# Patient Record
Sex: Male | Born: 1965 | Race: Black or African American | Hispanic: No | Marital: Married | Smoking: Current some day smoker
Health system: Southern US, Community
[De-identification: ages and names within clinical notes are randomized; demographics above are authoritative.]

## PROBLEM LIST (undated history)

## (undated) DIAGNOSIS — C801 Malignant (primary) neoplasm, unspecified: Secondary | ICD-10-CM

## (undated) DIAGNOSIS — G8929 Other chronic pain: Secondary | ICD-10-CM

## (undated) DIAGNOSIS — M549 Dorsalgia, unspecified: Secondary | ICD-10-CM

## (undated) DIAGNOSIS — I10 Essential (primary) hypertension: Secondary | ICD-10-CM

## (undated) DIAGNOSIS — R531 Weakness: Secondary | ICD-10-CM

---
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
## 2008-07-12 HISTORY — PX: ANTERIOR CERVICAL DECOMP/DISCECTOMY FUSION: SHX1161

## 2008-07-21 ENCOUNTER — Inpatient Hospital Stay (HOSPITAL_COMMUNITY): Admission: AD | Admit: 2008-07-21 | Discharge: 2008-07-23 | Payer: Self-pay | Admitting: Orthopedic Surgery

## 2010-02-03 IMAGING — RF DG CERVICAL SPINE 2 OR 3 VIEWS
1 series · 1 of 1 positions shown · non-contrast
Comparison: 07/19/2008

CLINICAL DATA: C5-6, C6-7 ACDF

CERVICAL SPINE - 2-3 VIEW

[Series 1: run · 1 of 1 slices shown]
[im 1/1]
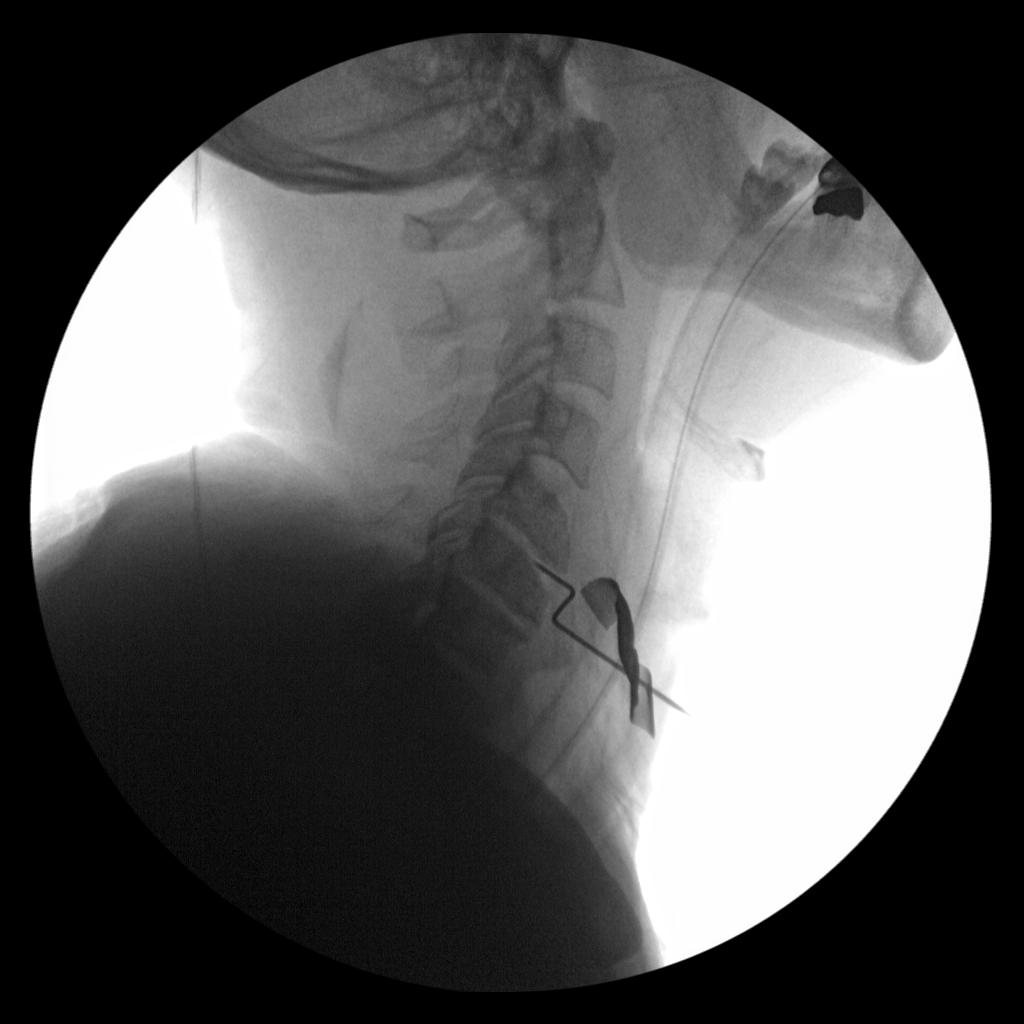

[1 of 1 positions shown; findings below may reference images not displayed]

FINDINGS: Single lateral intraoperative image of the cervical spine
demonstrates anterior localizing instrument at the C5-6 level.
IMPRESSION: Localization C5-6.

## 2010-08-24 LAB — BASIC METABOLIC PANEL
BUN: 9 mg/dL (ref 6–23)
CO2: 26 mEq/L (ref 19–32)
CO2: 28 mEq/L (ref 19–32)
Calcium: 9.2 mg/dL (ref 8.4–10.5)
Calcium: 9.4 mg/dL (ref 8.4–10.5)
Calcium: 9.5 mg/dL (ref 8.4–10.5)
Chloride: 104 mEq/L (ref 96–112)
Creatinine, Ser: 1.05 mg/dL (ref 0.4–1.5)
GFR calc Af Amer: >60 mL/min (ref >60)
GFR calc non Af Amer: >60 mL/min (ref >60)
GFR calc non Af Amer: >60 mL/min (ref >60)
Glucose, Bld: 105 mg/dL — ABNORMAL HIGH (ref 70–99)
Glucose, Bld: 123 mg/dL — ABNORMAL HIGH (ref 70–99)
Potassium: 3.7 mEq/L (ref 3.5–5.1)
Sodium: 135 mEq/L (ref 135–145)
Sodium: 138 mEq/L (ref 135–145)
Sodium: 139 mEq/L (ref 135–145)

## 2010-08-24 LAB — CBC
HCT: 41.4 % (ref 39.0–52.0)
Hemoglobin: 14.3 g/dL (ref 13.0–17.0)
Hemoglobin: 15.3 g/dL (ref 13.0–17.0)
MCHC: 34.1 g/dL (ref 30.0–36.0)
MCHC: 34.4 g/dL (ref 30.0–36.0)
MCV: 89.2 fL (ref 78.0–100.0)
Platelets: 168 10*3/uL (ref 150–400)
RDW: 13 % (ref 11.5–15.5)
RDW: 13.2 % (ref 11.5–15.5)
RDW: 13.6 % (ref 11.5–15.5)
WBC: 19 10*3/uL — ABNORMAL HIGH (ref 4.0–10.5)

## 2010-09-26 NOTE — Op Note (Signed)
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   OPERATIVE PROCEDURE:  Anterior cervical diskectomy fusion.   INSTRUMENTATION USED:  A 7-mm precut lordotic fibular strut intergraft  at 5-6 and 6-7 with a 34-mm anterior cervical Synthes vector plate with  16 mm screws.   FIRST ASSISTANT:  Vaughan Browner   COMPLICATIONS:  None.   CONDITION:  Stable.   HISTORY:  Travis Simpson is a very pleasant 45 year old gentleman who presented  to the care of my partner, Dr. Ethelene Hal, with ongoing significant neck and  left arm pain.  Clinical and radiographic analysis confirmed the  diagnosis of three-level degenerative cervical disease C5-6, C6-7, C7-  T1. The patient initially had some C8 symptoms. However, they were  minimal compared to the pain he had in the C6 and C7 distribution.  After discussing treatment options, which included two and a three level  ACDF, I recommend two level, since these were most pronounced levels and  clinically he had most of his symptoms in the C6-C7 distribution.  After  discussing treatment options and risks and benefits, he elected to  proceed with aforementioned procedure.   OPERATIVE NOTE:  The patient is brought to the operating room, placed  supine on the operating table.  After successful induction of general  anesthesia and endotracheal intubation, TEDs, SCDs, and a Foley were  applied.  The arms were secured to  the side.  Roll towels were placed  between the shoulder blades and the shoulders were taped down to to  allow for visualization with x-ray.  Anterior cervical spine was prepped  and draped in a standard fashion.  Time-out was done to confirm this was  the appropriate patient, surgical procedure, and symptomatic side.  Once  all in the room were agreed, I began the surgical procedure.   A left lateral incision was made in line with the sternocleidomastoid.  This is a standard Clementeen Graham approach to the anterior cervical  spine.  Sharp dissection was carried out down through the platysma and  then began bluntly dissected through the deep cervical fascia sweeping  of the trachea and esophagus medially and then protected carotid sheath  laterally with a finger.  At this point, I palpated the anterior  longitudinal ligament, then I placed a thyroid retractor.  With the  retractor in place, I began sweeping the prevertebral fascia and placed  an 18-gauge needle into the C5-6 disk space.  Intraoperative lateral  fluoroscopy confirmed that this with the appropriate level.   At this point in time, once the facet  level was identified, I then used  a Bovie to remove the anterior longitudinal ligament from the midbody C5  to the midbody of C7, and mobilizing longus coli muscles out laterally  to the level of the uncovertebral joints.  I then placed a Psychologist, counselling blade underneath the longus colli muscles, deflated the  endotracheal cuff, expanded the retractors and then reinflated the  endotracheal cuff.  I then placed distraction pins into the body of C5-  C6,  distracted to 5-6 disk space.   A 15 blade scalpel was used to incise the annulus and using a  combination of pituitary rongeurs, curettes, and Kerrison rongeurs, I  resected the entire disk material.  Posteriorly, there were significant  osteophyte formation and I used great care with 1-mm Kerrison resecting  the  osteophytes as much as I could.  I was able to sweep behind the  vertebral body with a nerve hook, ensuring that had an adequate  decompression.  I then repeated this entire procedure on 6-7 level.  Once I had both disks completed, I trialed implants and I noted that I  __________ distraction with indirect increase in the foraminal volume.  At this point with a satisfactory decompression done, I then packed to  precut fibular allograft struts and malleted into the appropriate depth.  There was significant anterior osteophytes noted, and resected.  At this  point with the osteophytes removed, I then contoured a 34-mm anterior  cervical plate and affixed it to the bodies of C5 and C7 with 16 mm  variable angle screws.  I used a single 80 mm screw and other 16 mm  screw at C6.  At the end of the case, I removed all of the retractors  and swept the lateral border to ensure that the esophagus was not  entrapped beneath the plate.  Once I was confirmed that the esophagus  was not entrapped underneath the plate, I then irrigated with copious  normal saline, obtained hemostasis using bipolar electrocautery,  irrigated copiously again, and then returned the trachea and esophagus  to midline.  I then closed platysma with interrupted 2-0 Vicryl sutures  and 3-0 Monocryl for the skin.  Steri-Strips, __________ dressing Aspen  collar was applied.  At the end of the case, all needles and sponge  counts were correct.  Final x-rays were satisfactory.  The patient was  extubated and transferred to PACU without incident.  At the end of the  case, all needle and sponge counts were correct.      Alvy Beal, MD  Electronically Signed     DDB/MEDQ  D:  07/21/2008  T:  07/22/2008  Job:  [id number]

## 2010-09-26 NOTE — H&P (Signed)
NAME:  Travis Simpson, Travis Simpson             ACCOUNT NO.:  [id number]   MEDICAL RECORD NO.:  [id number]          PATIENT TYPE:  AMB   LOCATION:  SDS                          FACILITY:  [facility]   PHYSICIAN:  Alvy Beal, MD    DATE OF BIRTH:  Oct 07, 1965   DATE OF ADMISSION:  DATE OF DISCHARGE:                              HISTORY & PHYSICAL   CHIEF COMPLAINT:  Neck and left-sided shoulder and arm pain.   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  The patient notes that ever since that time he  has had ongoing neck and left sided shoulder and arm pain.  He described  the pain in his shoulder and neck as a burning sensation and he gets  numbness and tingling down into his fourth and fifth digits in his left  hand.  The patient states the pain is approximately 8/10, and initially  starts in his fourth and fifth digit and eventually he states the rest  of the hand goes numb.  The patient states that he has tried physical  therapy and injection therapy and this has not provided him with any  relief.   PAST SURGICAL HISTORY:  Left arthroscopic knee surgery in 1981.   FAMILY HISTORY:  Unremarkable.   SOCIAL HISTORY:  The patient is a nonsmoker and does not use alcohol.  He is currently married and he lives at home with his wife and one  child.   CURRENT MEDICATIONS:  Hydrocodone and Mobic.   ALLERGIES:  NO KNOWN DRUG ALLERGIES.   REVIEW OF SYSTEMS:  Otherwise negative except stated in the HPI.   PHYSICAL EXAMINATION:  GENERAL:  Travis Simpson is a very pleasant 26-  year-old gentleman who appears his stated age.  He is alert and oriented  x3 and in no acute distress.  Mood and affect are appropriate.  His  primary care physician is Dr. Katrinka Blazing with [facility].  SKIN:  Otherwise negative for any rashes and/or bruises.  HEENT:  Atraumatic, normocephalic.  Eyes  were PERRL.  Throat was  negative for any erythema or tonsillar enlargement.  NECK:  Otherwise negative for any masses.  Negative for any trachea  deviation.  Thyroid was normal size.  He has mild decreased range of  motion of his spine secondary to pain.  HEART:  Regular rate and rhythm.  No murmurs, rubs or gallops.  LUNGS:  Chest was clear to auscultation with no wheezes or crackles  noted.  ABDOMEN:  Soft and nontender.  No rebound tenderness or guarding.  Spleen and liver size were within normal limits.  He had no CVA  tenderness.  MUSCULOSKELETAL:  Negative for any muscular atrophy.  He had some mild  weakness on his left side compared to his right upper extremity.  Isolated shoulder, elbow and wrist range of motion were within normal  limits.  He had no instability of his upper extremity joints.  VASCULAR:  Distal pulses were intact.  He had  no carotid bruits or JVD.  NEUROLOGIC:  Cranial nerves II-XII were grossly intact.  Sensations were  intact to light touch in his lower extremities.  On his upper  extremities, his left side was mildly decreased in sensation on the left  compared to the right.  Deep tendon reflexes were 2+ and symmetrical and  he had a normal gait pattern.   ASSESSMENT:  Degenerative disk disease of the cervical spine with  radicular arm pain.   PLAN:  At this time, the plan for this patient is to have him undergo an  anterior cervical diskectomy and fusion at the C5-6, C6-7 levels by Dr.  Shon Baton on July 21, 2008 at [facility].  Anticipated length of  stay is 1 day.      Crissie Reese, PA      Alvy Beal, MD  Electronically Signed    AC/MEDQ  D:  07/19/2008  T:  07/20/2008  Job:  [id number]

## 2010-09-29 NOTE — Discharge Summary (Signed)
Travis Simpson, Travis Simpson             ACCOUNT NO.:  [id number]   MEDICAL RECORD NO.:  [id number]          PATIENT TYPE:  INP   LOCATION:  5017                         FACILITY:  [facility]   PHYSICIAN:  Alvy Beal, MD    DATE OF BIRTH:  24-Dec-1965   DATE OF ADMISSION:  07/21/2008  DATE OF DISCHARGE:  07/23/2008                               DISCHARGE SUMMARY   ADMISSION DIAGNOSIS:  Degenerative cervical disk disease, C5-6 and C6-7  with hard disk osteophyte causing a left C6-7 radiculopathy.   DISCHARGE DIAGNOSIS:  Degenerative cervical disk disease, C5-6 and C6-7  with hard disk osteophyte causing a left C6-7 radiculopathy.   OPERATIVE PROCEDURE:  An anterior cervical diskectomy and fusion at C5-6  and at C6-7.   CONSULTATIONS:  None.   BRIEF HISTORY:  Mr. Grayer is a very pleasant 45 year old gentleman  who presented initially to Dr. Shon Baton' partner Dr. Ethelene Hal with ongoing  significant neck and left arm pain.  Clinical and radiographic analysis  confirmed the diagnosis of a 3-level degenerative disk disease at C5-6,  C6-7 as well as C7-T1.  The patient initially had some C8 symptoms,  however, they were minimal compared to the pain that he had at the C6  and C7 nerve distribution.  After discussing treatment options, which  included a 2 or 3 level ACDF.  Dr. Shon Baton recommended a 2-level since  those were the most pronounced levels and clinically, he had most of his  symptoms in the C6 and C7 distribution.  After discussing all treatment  options and risks and benefits, he elected to proceed with the  procedure.   HOSPITAL COURSE:  The patient's hospital course was approximately 2 days  in length.  He had tolerated the procedure very well and was transferred  from the OR to the PACU without incident and subsequently to the  orthopedic floor without any incident.  Postoperatively, on day #1, the  patient worked well with physical therapy.  He was tolerating regular  diet.  He  was voiding on his own, having a bowel movements and  compartments remained soft and nontender.  He had no chest pain or  shortness of breath.  Postoperatively, on day #2, the patient continued  to remain afebrile.  His labs were stable with a sodium of 139,  potassium 3.7, chloride 105, bicarb 27, glucose 89, BUN of 9, and  creatinine of 9.3.  Since the patient was afebrile, labs remained  stable, he was working well with physical therapy, he was deemed to be  in stable condition to discharge to home.   DISPOSITION:  Discharged to home.   DISCHARGE MEDICATIONS:  1. He is to continue his Flonase.  2. He is instructed to stop his meloxicam 7.5 mg as well as to stop      his hydrocodone 5/500 mg.  3. He is being discharged home on Percocet 10/325 one tab every 6      hours as needed for pain as well as Robaxin 500 mg 1 tablet every 8      hours as needed for muscle spasms.  DISCHARGE INSTRUCTIONS:  The patient is being discharged home with a  preprinted discharge instructions that he is allowed to shower  postoperatively, on day #5.  He needs to keep his cervical collar on at  all times.  He can increase activity slowly.  He can walk up steps.  No  lifting for 6 weeks.  He is not to lift anything heavier than a gallon  of milk, which is approximately 6-8 pounds.  No driving for at least 6  weeks.  He is to change his dressing daily for 7 days.  The patient is  to contact the office for increased fevers, chills, and/or arm pain,  increased cervical pain, loss of bowel or bladder function, or foot  drop.   FOLLOWUP:  The patient is instructed to follow up in our office with Dr.  Shon Baton in approximately 2 weeks for a wound check and to remove sutures.  The patient is given the phone number of [phone] to call and schedule  his appointment.      Crissie Reese, PA      Alvy Beal, MD  Electronically Signed    AC/MEDQ  D:  09/06/2008  T:  09/07/2008  Job:  [id number]    cc:   Alvy Beal, MD

## 2013-07-23 ENCOUNTER — Encounter (HOSPITAL_COMMUNITY): Payer: Self-pay | Admitting: Pharmacy Technician

## 2013-07-24 NOTE — Pre-Procedure Instructions (Signed)
Travis ShellingLarry E Simpson  07/24/2013   Your procedure is scheduled on:  Wed, Mar 18 @ 1:10 PM  [facility]  at 11:10 AM.  Call this number if you have problems the morning of surgery: [phone]   Remember:   Do not eat food or drink liquids after midnight.   Take these medicines the morning of surgery with A SIP OF WATER: Pain Pill(if needed)               No Goody's,BC's,Aleve,Aspirin,Ibuprofen,Fish Oil,or any Herbal Medications   Do not wear jewelry  Do not wear lotions, powders, or colognes. You may wear deodorant.  Men may shave face and neck.  Do not bring valuables to the hospital.  [facility] not responsible                  for any belongings or valuables.               Contacts, dentures or bridgework may not be worn into surgery.  Leave suitcase in the car. After surgery it may be brought to your room.  For patients admitted to the hospital, discharge time is determined by your                treatment team.                Special Instructions:  [place] - Preparing for Surgery  Before surgery, you can play an important role.  Because skin is not sterile, your skin needs to be as free of germs as possible.  You can reduce the number of germs on you skin by washing with CHG (chlorahexidine gluconate) soap before surgery.  CHG is an antiseptic cleaner which kills germs and bonds with the skin to continue killing germs even after washing.  Please DO NOT use if you have an allergy to CHG or antibacterial soaps.  If your skin becomes reddened/irritated stop using the CHG and inform your nurse when you arrive at Short Stay.  Do not shave (including legs and underarms) for at least 48 hours prior to the first CHG shower.  You may shave your face.  Please follow these instructions carefully:   1.  Shower with CHG Soap the night before surgery and the                                morning of Surgery.  2.  If you choose to wash your hair, wash your hair  first as usual with your       normal shampoo.  3.  After you shampoo, rinse your hair and body thoroughly to remove the                      Shampoo.  4.  Use CHG as you would any other liquid soap.  You can apply chg directly       to the skin and wash gently with scrungie or a clean washcloth.  5.  Apply the CHG Soap to your body ONLY FROM THE NECK DOWN.        Do not use on open wounds or open sores.  Avoid contact with your eyes,       ears, mouth and genitals (private parts).  Wash genitals (private parts)       with your normal soap.  6.  Wash thoroughly, paying special  attention to the area where your surgery        will be performed.  7.  Thoroughly rinse your body with warm water from the neck down.  8.  DO NOT shower/wash with your normal soap after using and rinsing off       the CHG Soap.  9.  Pat yourself dry with a clean towel.            10.  Wear clean pajamas.            11.  Place clean sheets on your bed the night of your first shower and do not        sleep with pets.  Day of Surgery  Do not apply any lotions/deoderants the morning of surgery.  Please wear clean clothes to the hospital/surgery center.     Please read over the following fact sheets that you were given: Pain Booklet, Coughing and Deep Breathing, Blood Transfusion Information, MRSA Information and Surgical Site Infection Prevention

## 2013-07-27 ENCOUNTER — Encounter (HOSPITAL_COMMUNITY): Payer: Self-pay

## 2013-07-27 ENCOUNTER — Encounter (HOSPITAL_COMMUNITY)
Admission: RE | Admit: 2013-07-27 | Discharge: 2013-07-27 | Disposition: A | Discharge status: Home / Self Care | Payer: 59 | Source: Ambulatory Visit | Attending: Orthopedic Surgery | Admitting: Orthopedic Surgery

## 2013-07-27 HISTORY — DX: Dorsalgia, unspecified: M54.9

## 2013-07-27 HISTORY — DX: Weakness: R53.1

## 2013-07-27 HISTORY — DX: Other chronic pain: G89.29

## 2013-07-27 LAB — CBC
HCT: 44.6 % (ref 39.0–52.0)
HEMOGLOBIN: 15 g/dL (ref 13.0–17.0)
MCH: 30.1 pg (ref 26.0–34.0)
MCHC: 33.6 g/dL (ref 30.0–36.0)
MCV: 89.6 fL (ref 78.0–100.0)
Platelets: 216 10*3/uL (ref 150–400)
RBC: 4.98 MIL/uL (ref 4.22–5.81)
RDW: 13.5 % (ref 11.5–15.5)
WBC: 9.7 10*3/uL (ref 4.0–10.5)

## 2013-07-27 LAB — SURGICAL PCR SCREEN
MRSA, PCR: NEGATIVE
Staphylococcus aureus: NEGATIVE

## 2013-07-27 LAB — TYPE AND SCREEN
ABO/RH(D): O POS
Antibody Screen: NEGATIVE

## 2013-07-27 LAB — ABO/RH: ABO/RH(D): O POS

## 2013-07-27 NOTE — H&P (Signed)
History of Present Illness  The patient is a 48 year old male who comes in today for a preoperative History and Physical. The patient is scheduled for a TLIF L4-5 to be performed by Dr. Debria Garret D. Shon Baton, MD at [facility] on 07/30/2013 . Please see the hospital record for complete dictated history and physical.   Follow-up backis described as the following: The patient is being followed for their back pain. Symptoms reported today include: pain, aching, leg pain (right) and foot pain (right). The patient states that they are doing poorly. Current treatment includes: NSAIDs (Advil). The following medication has been used for pain control: antiinflammatory medication. The patient reports their current pain level to be 6 / 10. The patient presents today following physical therapy (@ [facility]). The patient has not gotten any relief of their symptoms with physical therapy (increased pain).   48 year old male has a history of L4-5 stenosis and ongoing low back pain, buttock, and right leg pain returns. Symptoms are unchanged from previous visit. He is wanting to proceed with L4-5 TLIF as scheduled this week. No complaints of bowel or bladder incontinence.       Allergies No Known Drug Allergies. 12/23/2012    Family History Family history unknown - Adopted    Social History Alcohol use. current drinker; drinks beer; only occasionally per week Children. 1 Current work status. working full time Drug/Alcohol Rehab (Currently). no Drug/Alcohol Rehab (Previously). no Exercise. Exercises daily; does running / walking Illicit drug use. no Living situation. live with spouse Marital status. married Number of flights of stairs before winded. 2-3 Pain Contract. no Tobacco / smoke exposure. yes outdoors only    Medication History Advil (200MG  Tablet, Oral) Active. TraMADol HCl (50MG  Tablet, Oral) Active. (tid prn) Norco (7.5-325MG  Tablet, Oral) Active.  (tid prn)    Vitals 07/27/2013 8:57 AM Weight: 204 lb Height: 74 in Body Surface Area: 2.2 m Body Mass Index: 26.19 kg/m Temp.: 98.2 FPulse: 76 (Regular) BP: 121/74 (Sitting, Left Arm, Standard)     Objective Transcription  Very pleasant male, alert and oriented x3 and in no acute distress. Gait is normal. No increase in respiratory effort. Cervical spine is unremarkable. Head: Normocephalic, atraumatic. PERRLA EOMI. Lungs: CTA bilaterally. No wheezing noted. Heart: RRR, no murmurs noted. Abdomen: Round, nondistended. Negative bowel sounds x4, soft and nontender. He is neurologically intact. Skin is normal and dry.   Assessments Transcription  L4-5 stenosis with ongoing low back pain and right lower extremity radiculopathy. Failed conservative treatment.   We will proceed with L4-5 TLIF as scheduled. Discussed procedure along with potential rehab/recovery time and risk complications in great detail. All questions were answered and encouraged. The patient was seen in conjunction with Dr. Shon Baton.   The patient is a 48 year old male who has a history of low back pain and right lower extremity radiculopathy that returns today. As previously documented the patient had a lumbar spine MRI scan on 05-12-13. This showed L4-5 moderate right posterior lateral extrusion causing a mass effect on the extending L4 and traversing L5 root. Recently, he has been through formal physical therapy. He states he is not having improvement. No relief with ESI of September 2014. He states the pain radiates down his right buttock down to his right foot. Some pain in the left lateral hip area but nothing down his left leg. No complains of bowel or bladder incontinence. He has exhausted all conservative measures. He wants to proceed with surgery.  At  this point in time we had a long discussion. He has had previous injections, physical therapy, anti inflammatory medication, and  narcotic medication and he continues to deteriorate. This has gone on now for about 6 months now and it is progressively deteriorating his quality of life. Given the failure and the appropriate long term conservative management, I have recommended that we proceed with a T-LIF. This will allow for direct posterior neural decompression at the 4-5 level. Removing that fragmented disc that is irritating the nerve and stabilizing the level to address his back. The risks including infection, bleeding, nerve damage, death, stroke, paralysis, failure to heal, need for further surgery, blood clots, damage to the bowel and bladder and the major blood vessels, leak in spinal fluid, nonunion, and adjacent segment disease. We have discussed the risks and he has expressed an understanding of these. We have discussed the outcome, which is reduction, not elimination of his pain and improved quality of life. I would expect him to be in the hospital about 2-3 nights and then at 4 and 6 weeks start formalized therapy and hopefully around 3-5 months he will start reengaging at work and in his regular activities. He would be completely fused at around 9-12 months.

## 2013-07-27 NOTE — Progress Notes (Signed)
Pt doesn't have a cardiologist  Denies ever having an echo/stress test/heart cath  Medical Md is Tomasa HostellerDr.Cassandra Smith with [facility]  Denies EKG or CXR in past yr

## 2013-07-28 MED ORDER — ACETAMINOPHEN 10 MG/ML IV SOLN
1000.0000 mg | Freq: Four times a day (QID) | INTRAVENOUS | Status: DC
  Filled 2013-07-28: qty 100

## 2013-07-28 MED ORDER — DEXAMETHASONE SODIUM PHOSPHATE 4 MG/ML IJ SOLN
4.0000 mg | Freq: Once | INTRAMUSCULAR | Status: AC
  Administered 2013-07-29: 10 mg via INTRAVENOUS
  Filled 2013-07-28: qty 1

## 2013-07-29 ENCOUNTER — Inpatient Hospital Stay (HOSPITAL_COMMUNITY): Payer: 59 | Admitting: Anesthesiology

## 2013-07-29 ENCOUNTER — Inpatient Hospital Stay (HOSPITAL_COMMUNITY)
Admission: RE | Admit: 2013-07-29 | Discharge: 2013-08-01 | DRG: 460 | Disposition: A | Discharge status: Home / Self Care | Payer: 59 | Source: Ambulatory Visit | Attending: Orthopedic Surgery | Admitting: Orthopedic Surgery

## 2013-07-29 ENCOUNTER — Inpatient Hospital Stay (HOSPITAL_COMMUNITY): Payer: 59

## 2013-07-29 ENCOUNTER — Encounter (HOSPITAL_COMMUNITY): Payer: 59 | Admitting: Anesthesiology

## 2013-07-29 ENCOUNTER — Encounter (HOSPITAL_COMMUNITY)
Admission: RE | Disposition: A | Discharge status: Home / Self Care | Payer: Self-pay | Source: Ambulatory Visit | Attending: Orthopedic Surgery

## 2013-07-29 ENCOUNTER — Encounter (HOSPITAL_COMMUNITY): Payer: Self-pay | Admitting: *Deleted

## 2013-07-29 DIAGNOSIS — M549 Dorsalgia, unspecified: Secondary | ICD-10-CM

## 2013-07-29 DIAGNOSIS — M5137 Other intervertebral disc degeneration, lumbosacral region: Principal | ICD-10-CM | POA: Diagnosis present

## 2013-07-29 DIAGNOSIS — M51379 Other intervertebral disc degeneration, lumbosacral region without mention of lumbar back pain or lower extremity pain: Principal | ICD-10-CM | POA: Diagnosis present

## 2013-07-29 HISTORY — PX: POSTERIOR LUMBAR FUSION: SHX6036

## 2013-07-29 SURGERY — POSTERIOR LUMBAR FUSION 1 LEVEL
Anesthesia: General | Site: Spine Lumbar

## 2013-07-29 MED ORDER — LACTATED RINGERS IV SOLN
INTRAVENOUS | Status: DC | PRN
  Administered 2013-07-29 (×4): via INTRAVENOUS

## 2013-07-29 MED ORDER — MENTHOL 3 MG MT LOZG: 1.0000 | LOZENGE | OROMUCOSAL | Status: DC | PRN

## 2013-07-29 MED ORDER — ONDANSETRON HCL 4 MG/2ML IJ SOLN: 4.0000 mg | Freq: Four times a day (QID) | INTRAMUSCULAR | Status: DC | PRN

## 2013-07-29 MED ORDER — LACTATED RINGERS IV SOLN
INTRAVENOUS | Status: DC
  Administered 2013-07-30: 06:00:00 via INTRAVENOUS

## 2013-07-29 MED ORDER — OXYCODONE HCL 5 MG/5ML PO SOLN: 5.0000 mg | Freq: Once | ORAL | Status: DC | PRN

## 2013-07-29 MED ORDER — ONDANSETRON HCL 4 MG/2ML IJ SOLN: 4.0000 mg | Freq: Once | INTRAMUSCULAR | Status: DC | PRN

## 2013-07-29 MED ORDER — MORPHINE SULFATE (PF) 1 MG/ML IV SOLN
INTRAVENOUS | Status: AC
  Filled 2013-07-29: qty 25

## 2013-07-29 MED ORDER — ACETAMINOPHEN 325 MG PO TABS: 325.0000 mg | ORAL_TABLET | ORAL | Status: DC | PRN

## 2013-07-29 MED ORDER — DEXAMETHASONE 4 MG PO TABS
4.0000 mg | ORAL_TABLET | Freq: Four times a day (QID) | ORAL | Status: DC
  Administered 2013-07-29 – 2013-07-31 (×7): 4 mg via ORAL
  Filled 2013-07-29 (×14): qty 1

## 2013-07-29 MED ORDER — FENTANYL CITRATE 0.05 MG/ML IJ SOLN
INTRAMUSCULAR | Status: AC
  Filled 2013-07-29: qty 5

## 2013-07-29 MED ORDER — ONDANSETRON HCL 4 MG/2ML IJ SOLN
INTRAMUSCULAR | Status: DC | PRN
  Administered 2013-07-29 (×2): 4 mg via INTRAVENOUS

## 2013-07-29 MED ORDER — HYDROMORPHONE HCL PF 1 MG/ML IJ SOLN
INTRAMUSCULAR | Status: AC
  Administered 2013-07-29: 0.5 mg via INTRAVENOUS
  Filled 2013-07-29: qty 1

## 2013-07-29 MED ORDER — METHOCARBAMOL 500 MG PO TABS
500.0000 mg | ORAL_TABLET | Freq: Four times a day (QID) | ORAL | Status: DC | PRN
  Administered 2013-07-30 – 2013-08-01 (×4): 500 mg via ORAL
  Filled 2013-07-29 (×4): qty 1

## 2013-07-29 MED ORDER — LACTATED RINGERS IV SOLN
INTRAVENOUS | Status: DC
Start: 2013-07-29 — End: 2013-08-01
  Administered 2013-07-29: 12:00:00 via INTRAVENOUS

## 2013-07-29 MED ORDER — ONDANSETRON HCL 4 MG/2ML IJ SOLN
INTRAMUSCULAR | Status: AC
  Filled 2013-07-29: qty 2

## 2013-07-29 MED ORDER — OXYCODONE HCL 5 MG PO TABS: 5.0000 mg | ORAL_TABLET | Freq: Once | ORAL | Status: DC | PRN

## 2013-07-29 MED ORDER — LIDOCAINE HCL (CARDIAC) 20 MG/ML IV SOLN
INTRAVENOUS | Status: AC
  Filled 2013-07-29: qty 5

## 2013-07-29 MED ORDER — MIDAZOLAM HCL 5 MG/5ML IJ SOLN
INTRAMUSCULAR | Status: DC | PRN
  Administered 2013-07-29: 2 mg via INTRAVENOUS

## 2013-07-29 MED ORDER — DEXAMETHASONE SODIUM PHOSPHATE 4 MG/ML IJ SOLN
4.0000 mg | Freq: Four times a day (QID) | INTRAMUSCULAR | Status: DC
  Filled 2013-07-29 (×14): qty 1

## 2013-07-29 MED ORDER — PROPOFOL 10 MG/ML IV BOLUS
INTRAVENOUS | Status: AC
  Filled 2013-07-29: qty 20

## 2013-07-29 MED ORDER — ACETAMINOPHEN 160 MG/5ML PO SOLN: 325.0000 mg | ORAL | Status: DC | PRN

## 2013-07-29 MED ORDER — 0.9 % SODIUM CHLORIDE (POUR BTL) OPTIME
TOPICAL | Status: DC | PRN
  Administered 2013-07-29: 1000 mL

## 2013-07-29 MED ORDER — PROPOFOL 10 MG/ML IV BOLUS
INTRAVENOUS | Status: DC | PRN
  Administered 2013-07-29: 180 mg via INTRAVENOUS

## 2013-07-29 MED ORDER — FENTANYL CITRATE 0.05 MG/ML IJ SOLN
INTRAMUSCULAR | Status: DC | PRN
  Administered 2013-07-29 (×3): 100 ug via INTRAVENOUS
  Administered 2013-07-29: 50 ug via INTRAVENOUS
  Administered 2013-07-29: 100 ug via INTRAVENOUS
  Administered 2013-07-29: 50 ug via INTRAVENOUS
  Administered 2013-07-29 (×2): 100 ug via INTRAVENOUS
  Administered 2013-07-29: 50 ug via INTRAVENOUS
  Administered 2013-07-29 (×2): 100 ug via INTRAVENOUS
  Administered 2013-07-29: 50 ug via INTRAVENOUS

## 2013-07-29 MED ORDER — DIPHENHYDRAMINE HCL 50 MG/ML IJ SOLN: 12.5000 mg | Freq: Four times a day (QID) | INTRAMUSCULAR | Status: DC | PRN

## 2013-07-29 MED ORDER — LIDOCAINE HCL (CARDIAC) 20 MG/ML IV SOLN
INTRAVENOUS | Status: DC | PRN
  Administered 2013-07-29: 50 mg via INTRAVENOUS
  Administered 2013-07-29: 70 mg via INTRAVENOUS

## 2013-07-29 MED ORDER — SODIUM CHLORIDE 0.9 % IJ SOLN: 9.0000 mL | INTRAMUSCULAR | Status: DC | PRN

## 2013-07-29 MED ORDER — BUPIVACAINE-EPINEPHRINE 0.25% -1:200000 IJ SOLN
INTRAMUSCULAR | Status: DC | PRN
  Administered 2013-07-29: 10 mL

## 2013-07-29 MED ORDER — SUCCINYLCHOLINE CHLORIDE 20 MG/ML IJ SOLN
INTRAMUSCULAR | Status: DC | PRN
  Administered 2013-07-29: 100 mg via INTRAVENOUS

## 2013-07-29 MED ORDER — OXYCODONE HCL 5 MG PO TABS
10.0000 mg | ORAL_TABLET | ORAL | Status: DC | PRN
  Administered 2013-07-30 – 2013-08-01 (×8): 10 mg via ORAL
  Filled 2013-07-29 (×8): qty 2

## 2013-07-29 MED ORDER — ONDANSETRON HCL 4 MG/2ML IJ SOLN
4.0000 mg | INTRAMUSCULAR | Status: DC | PRN
  Administered 2013-07-31: 4 mg via INTRAVENOUS
  Filled 2013-07-29: qty 2

## 2013-07-29 MED ORDER — CEFAZOLIN SODIUM 1-5 GM-% IV SOLN
1.0000 g | Freq: Three times a day (TID) | INTRAVENOUS | Status: AC
  Administered 2013-07-29 – 2013-07-30 (×2): 1 g via INTRAVENOUS
  Filled 2013-07-29 (×2): qty 50

## 2013-07-29 MED ORDER — METHOCARBAMOL 100 MG/ML IJ SOLN
500.0000 mg | Freq: Four times a day (QID) | INTRAVENOUS | Status: DC | PRN
  Filled 2013-07-29: qty 5

## 2013-07-29 MED ORDER — PHENYLEPHRINE 40 MCG/ML (10ML) SYRINGE FOR IV PUSH (FOR BLOOD PRESSURE SUPPORT)
PREFILLED_SYRINGE | INTRAVENOUS | Status: AC
  Filled 2013-07-29: qty 10

## 2013-07-29 MED ORDER — SODIUM CHLORIDE 0.9 % IV SOLN: 250.0000 mL | INTRAVENOUS | Status: DC

## 2013-07-29 MED ORDER — HEMOSTATIC AGENTS (NO CHARGE) OPTIME
TOPICAL | Status: DC | PRN
  Administered 2013-07-29: 1 via TOPICAL

## 2013-07-29 MED ORDER — DOCUSATE SODIUM 100 MG PO CAPS
100.0000 mg | ORAL_CAPSULE | Freq: Two times a day (BID) | ORAL | Status: DC
  Administered 2013-07-29 – 2013-08-01 (×6): 100 mg via ORAL
  Filled 2013-07-29 (×8): qty 1

## 2013-07-29 MED ORDER — ACETAMINOPHEN 10 MG/ML IV SOLN
1000.0000 mg | Freq: Four times a day (QID) | INTRAVENOUS | Status: AC
  Administered 2013-07-29 – 2013-07-30 (×4): 1000 mg via INTRAVENOUS
  Filled 2013-07-29 (×4): qty 100

## 2013-07-29 MED ORDER — SODIUM CHLORIDE 0.9 % IJ SOLN
3.0000 mL | Freq: Two times a day (BID) | INTRAMUSCULAR | Status: DC
  Administered 2013-07-30 – 2013-07-31 (×3): 3 mL via INTRAVENOUS

## 2013-07-29 MED ORDER — HYDROMORPHONE HCL PF 1 MG/ML IJ SOLN
0.2500 mg | INTRAMUSCULAR | Status: DC | PRN
  Administered 2013-07-29 (×4): 0.5 mg via INTRAVENOUS

## 2013-07-29 MED ORDER — NALOXONE HCL 0.4 MG/ML IJ SOLN: 0.4000 mg | INTRAMUSCULAR | Status: DC | PRN

## 2013-07-29 MED ORDER — MIDAZOLAM HCL 2 MG/2ML IJ SOLN
INTRAMUSCULAR | Status: AC
  Filled 2013-07-29: qty 2

## 2013-07-29 MED ORDER — PHENOL 1.4 % MT LIQD: 1.0000 | OROMUCOSAL | Status: DC | PRN

## 2013-07-29 MED ORDER — FLEET ENEMA 7-19 GM/118ML RE ENEM: 1.0000 | ENEMA | Freq: Once | RECTAL | Status: AC | PRN

## 2013-07-29 MED ORDER — MORPHINE SULFATE (PF) 1 MG/ML IV SOLN
INTRAVENOUS | Status: DC
  Administered 2013-07-29: 19:00:00 via INTRAVENOUS
  Administered 2013-07-30: 15 mg via INTRAVENOUS
  Administered 2013-07-30: 23 mg via INTRAVENOUS
  Administered 2013-07-30: 6 mg via INTRAVENOUS
  Filled 2013-07-29: qty 25

## 2013-07-29 MED ORDER — BUPIVACAINE-EPINEPHRINE (PF) 0.25% -1:200000 IJ SOLN
INTRAMUSCULAR | Status: AC
  Filled 2013-07-29: qty 30

## 2013-07-29 MED ORDER — DIPHENHYDRAMINE HCL 12.5 MG/5ML PO ELIX: 12.5000 mg | ORAL_SOLUTION | Freq: Four times a day (QID) | ORAL | Status: DC | PRN

## 2013-07-29 MED ORDER — THROMBIN 20000 UNITS EX SOLR
CUTANEOUS | Status: AC
  Filled 2013-07-29: qty 20000

## 2013-07-29 MED ORDER — SODIUM CHLORIDE 0.9 % IJ SOLN: 3.0000 mL | INTRAMUSCULAR | Status: DC | PRN

## 2013-07-29 MED ORDER — CEFAZOLIN SODIUM-DEXTROSE 2-3 GM-% IV SOLR
INTRAVENOUS | Status: AC
  Administered 2013-07-29: 2 g via INTRAVENOUS
  Filled 2013-07-29: qty 50

## 2013-07-29 MED ORDER — LIDOCAINE HCL (CARDIAC) 20 MG/ML IV SOLN
INTRAVENOUS | Status: AC
  Filled 2013-07-29: qty 10

## 2013-07-29 MED ORDER — THROMBIN 20000 UNITS EX SOLR
CUTANEOUS | Status: DC | PRN
  Administered 2013-07-29: 16:00:00 via TOPICAL

## 2013-07-29 SURGICAL SUPPLY — 78 items
BLADE SURG ROTATE 9660 (MISCELLANEOUS) IMPLANT
BUR EGG ELITE 4.0 (BURR) IMPLANT
BUR EGG ELITE 4.0MM (BURR)
CLIP NEUROVISION LG (CLIP) ×3 IMPLANT
CLOSURE STERI-STRIP 1/2X4 (GAUZE/BANDAGES/DRESSINGS) ×2
CLSR STERI-STRIP ANTIMIC 1/2X4 (GAUZE/BANDAGES/DRESSINGS) ×4 IMPLANT
CORDS BIPOLAR (ELECTRODE) ×3 IMPLANT
COVER MAYO STAND STRL (DRAPES) ×6 IMPLANT
COVER SURGICAL LIGHT HANDLE (MISCELLANEOUS) ×3 IMPLANT
DRAPE C-ARM 42X72 X-RAY (DRAPES) ×3 IMPLANT
DRAPE C-ARMOR (DRAPES) ×3 IMPLANT
DRAPE ORTHO SPLIT 77X108 STRL (DRAPES) ×2
DRAPE POUCH INSTRU U-SHP 10X18 (DRAPES) ×6 IMPLANT
DRAPE SURG 17X23 STRL (DRAPES) ×3 IMPLANT
DRAPE SURG ORHT 6 SPLT 77X108 (DRAPES) ×1 IMPLANT
DRAPE U-SHAPE 47X51 STRL (DRAPES) ×3 IMPLANT
DRSG MEPILEX BORDER 4X4 (GAUZE/BANDAGES/DRESSINGS) ×3 IMPLANT
DRSG MEPILEX BORDER 4X8 (GAUZE/BANDAGES/DRESSINGS) ×3 IMPLANT
DURAPREP 26ML APPLICATOR (WOUND CARE) ×3 IMPLANT
ELECT BLADE 4.0 EZ CLEAN MEGAD (MISCELLANEOUS)
ELECT BLADE 6.5 EXT (BLADE) IMPLANT
ELECT REM PT RETURN 9FT ADLT (ELECTROSURGICAL) ×3
ELECTRODE BLDE 4.0 EZ CLN MEGD (MISCELLANEOUS) IMPLANT
ELECTRODE REM PT RTRN 9FT ADLT (ELECTROSURGICAL) ×1 IMPLANT
GLOVE BIOGEL PI IND STRL 8 (GLOVE) ×1 IMPLANT
GLOVE BIOGEL PI IND STRL 8.5 (GLOVE) ×1 IMPLANT
GLOVE BIOGEL PI INDICATOR 8 (GLOVE) ×2
GLOVE BIOGEL PI INDICATOR 8.5 (GLOVE) ×2
GLOVE ECLIPSE 8.5 STRL (GLOVE) ×3 IMPLANT
GLOVE ORTHO TXT STRL SZ7.5 (GLOVE) ×3 IMPLANT
GOWN STRL REUS W/ TWL LRG LVL3 (GOWN DISPOSABLE) ×1 IMPLANT
GOWN STRL REUS W/TWL 2XL LVL3 (GOWN DISPOSABLE) ×6 IMPLANT
GOWN STRL REUS W/TWL LRG LVL3 (GOWN DISPOSABLE) ×2
GUIDEWIRE NITINOL BEVEL TIP (WIRE) ×12 IMPLANT
KIT BASIN OR (CUSTOM PROCEDURE TRAY) ×3 IMPLANT
KIT NEEDLE NVM5 EMG ELECT (KITS) ×1 IMPLANT
KIT NEEDLE NVM5 EMG ELECTRODE (KITS) ×2
KIT POSITION SURG JACKSON T1 (MISCELLANEOUS) IMPLANT
KIT ROOM TURNOVER OR (KITS) ×3 IMPLANT
LIGHT SOURCE ANGLE TIP STR 7FT (MISCELLANEOUS) ×3 IMPLANT
MAS TLIF HOOP SHIM (KITS) ×3 IMPLANT
NEEDLE 22X1 1/2 (OR ONLY) (NEEDLE) ×3 IMPLANT
NEEDLE I-PASS III (NEEDLE) ×3 IMPLANT
NEEDLE SPNL 18GX3.5 QUINCKE PK (NEEDLE) ×6 IMPLANT
NS IRRIG 1000ML POUR BTL (IV SOLUTION) ×3 IMPLANT
PACK LAMINECTOMY ORTHO (CUSTOM PROCEDURE TRAY) ×3 IMPLANT
PACK UNIVERSAL I (CUSTOM PROCEDURE TRAY) ×3 IMPLANT
PAD ARMBOARD 7.5X6 YLW CONV (MISCELLANEOUS) ×6 IMPLANT
PATTIES SURGICAL .5 X.5 (GAUZE/BANDAGES/DRESSINGS) IMPLANT
PATTIES SURGICAL .5 X1 (DISPOSABLE) ×3 IMPLANT
PRECEPT SHANK 6.5X45 (Neuro Prosthesis/Implant) ×6 IMPLANT
PRECEPT TULIPS (Neuro Prosthesis/Implant) ×6 IMPLANT
PROBE BALL TIP NVM5 SNG USE (BALLOONS) ×3 IMPLANT
PUTTY BONE DBX 5CC MIX (Putty) ×3 IMPLANT
ROD 50MM (Rod) ×6 IMPLANT
SCREW POLYAXIAL 6.5X50MM (Screw) ×3 IMPLANT
SCREW PRECEPT 6.5X45 (Screw) ×3 IMPLANT
SCREW PRECEPT SET (Screw) ×12 IMPLANT
SCREW PRECEPT SHANK 6.5X50MM (Screw) ×3 IMPLANT
SHEET CONFORM 45LX20WX5H (Bone Implant) ×3 IMPLANT
SPONGE LAP 4X18 X RAY DECT (DISPOSABLE) ×6 IMPLANT
SPONGE SURGIFOAM ABS GEL 100 (HEMOSTASIS) ×3 IMPLANT
SURGIFLO TRUKIT (HEMOSTASIS) ×3 IMPLANT
SUT MON AB 3-0 SH 27 (SUTURE) ×4
SUT MON AB 3-0 SH27 (SUTURE) ×2 IMPLANT
SUT VIC AB 1 CT1 18XCR BRD 8 (SUTURE) ×2 IMPLANT
SUT VIC AB 1 CT1 8-18 (SUTURE) ×4
SUT VIC AB 1 CTX 18 (SUTURE) IMPLANT
SUT VIC AB 2-0 CT1 18 (SUTURE) ×3 IMPLANT
SYR BULB IRRIGATION 50ML (SYRINGE) ×3 IMPLANT
SYR CONTROL 10ML LL (SYRINGE) ×3 IMPLANT
TLIF XLRG 11MM (Neuro Prosthesis/Implant) ×3 IMPLANT
TOWEL OR 17X24 6PK STRL BLUE (TOWEL DISPOSABLE) ×3 IMPLANT
TOWEL OR 17X26 10 PK STRL BLUE (TOWEL DISPOSABLE) ×3 IMPLANT
TRAY FOLEY CATH 14FR (SET/KITS/TRAYS/PACK) ×3 IMPLANT
TRAY FOLEY CATH 16FRSI W/METER (SET/KITS/TRAYS/PACK) IMPLANT
WATER STERILE IRR 1000ML POUR (IV SOLUTION) IMPLANT
YANKAUER SUCT BULB TIP NO VENT (SUCTIONS) ×3 IMPLANT

## 2013-07-29 NOTE — Anesthesia Preprocedure Evaluation (Addendum)
Anesthesia Evaluation  Patient identified by MRN, date of birth, ID band Patient awake    Reviewed: Allergy & Precautions, H&P , NPO status , Patient's Chart, lab work & pertinent test results  History of Anesthesia Complications Negative for: history of anesthetic complications  Airway Mallampati: II TM Distance: >3 FB Neck ROM: Full    Dental  (+) Teeth Intact,    Pulmonary neg shortness of breath, neg sleep apnea, neg COPDCurrent Smoker,  breath sounds clear to auscultation        Cardiovascular - angina- Past MI, - CHF and - DOE - pacemaker- Valvular Problems/MurmursRhythm:Regular Rate:Normal     Neuro/Psych  L4-5 stenosis and ongoing low back pain, buttock, and right leg pain return  Neuromuscular disease negative psych ROS   GI/Hepatic negative GI ROS, Neg liver ROS,   Endo/Other  negative endocrine ROS  Renal/GU negative Renal ROS     Musculoskeletal   Abdominal   Peds  Hematology negative hematology ROS (+)   Anesthesia Other Findings   Reproductive/Obstetrics                          Anesthesia Physical Anesthesia Plan  ASA: II  Anesthesia Plan: General   Post-op Pain Management:    Induction: Intravenous  Airway Management Planned: Oral ETT  Additional Equipment: None  Intra-op Plan:   Post-operative Plan: Extubation in OR  Informed Consent: I have reviewed the patients History and Physical, chart, labs and discussed the procedure including the risks, benefits and alternatives for the proposed anesthesia with the patient or authorized representative who has indicated his/her understanding and acceptance.   Dental advisory given  Plan Discussed with: CRNA and Surgeon  Anesthesia Plan Comments:         Anesthesia Quick Evaluation

## 2013-07-29 NOTE — Transfer of Care (Signed)
Immediate Anesthesia Transfer of Care Note  Patient: Travis Simpson  Procedure(s) Performed: Procedure(s): TLIF L4-5  (N/A)  Patient Location: PACU  Anesthesia Type:General  Level of Consciousness: oriented, sedated, patient cooperative and responds to stimulation  Airway & Oxygen Therapy: Patient Spontanous Breathing and Patient connected to nasal cannula oxygen  Post-op Assessment: Report given to PACU RN, Post -op Vital signs reviewed and stable, Patient moving all extremities and Patient moving all extremities X 4  Post vital signs: Reviewed and stable  Complications: No apparent anesthesia complications

## 2013-07-29 NOTE — H&P (Signed)
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  Will be addressing the L4/5 level which cooresponds to the second to last full disc space. H+P reviewed

## 2013-07-29 NOTE — Brief Op Note (Signed)
07/29/2013  5:38 PM  PATIENT:  Travis Simpson  48 y.o. male  PRE-OPERATIVE DIAGNOSIS:  L4-5 DISC HERNATION WITH STENOSIS/DDD  POST-OPERATIVE DIAGNOSIS:  L4-5 DISC HERNATION WITH STENOSIS/DDD  PROCEDURE:  Procedure(s): TLIF L4-5  (N/A)  SURGEON:  Surgeon(s) and Role:    * Venita Lickahari Brylin Stanislawski, MD - Primary  PHYSICIAN ASSISTANT:   ASSISTANTS: Zonia KiefJames Owens   ANESTHESIA:   general  EBL:  Total I/O In: 2000 [I.V.:2000] Out: 400 [Urine:300; Blood:100]  BLOOD ADMINISTERED:none  DRAINS: none   LOCAL MEDICATIONS USED:  MARCAINE     SPECIMEN:  No Specimen  DISPOSITION OF SPECIMEN:  N/A  COUNTS:  YES  TOURNIQUET:  * No tourniquets in log *  DICTATION: .Other Dictation: Dictation Number [id number]  PLAN OF CARE: Admit for overnight observation  PATIENT DISPOSITION:  PACU - hemodynamically stable.

## 2013-07-30 ENCOUNTER — Encounter (HOSPITAL_COMMUNITY): Payer: Self-pay | Admitting: General Practice

## 2013-07-30 ENCOUNTER — Inpatient Hospital Stay (HOSPITAL_COMMUNITY): Payer: 59

## 2013-07-30 NOTE — Evaluation (Signed)
Physical Therapy Evaluation & Discharge Patient Details Name: Travis Simpson MRN: [id number] DOB: [birth year] Today's Date: 07/30/2013 Time: 4098-1191 PT Time Calculation (min): 27 min  PT Assessment / Plan / Recommendation History of Present Illness  Pt s/p posterior lumbar fusion 1 level for back pain  Clinical Impression  Patient evaluated by Physical Therapy with no further acute PT needs identified. All education has been completed and the patient has no further questions. Pt safely completed stair training and is not concerned with any other mobility  Issues upon discharge. See below for any follow-up Physial Therapy or equipment needs. PT is signing off. Thank you for this referral.     PT Assessment  Patent does not need any further PT services    Follow Up Recommendations  No PT follow up    Does the patient have the potential to tolerate intense rehabilitation      Barriers to Discharge        Equipment Recommendations  3in1 (PT);Rolling walker with 5" wheels    Recommendations for Other Services     Frequency      Precautions / Restrictions Precautions Precautions: Back Precaution Booklet Issued: Yes (comment) Precaution Comments: handout reviewed Required Braces or Orthoses: Spinal Brace Spinal Brace: Lumbar corset;Applied in sitting position Restrictions Weight Bearing Restrictions: No   Pertinent Vitals/Pain 5/10 - states nurse recently administered medications but will call for nurse if pain becomes worse Pt repositioned in chair for comfort; spinal brace on       Mobility  Bed Mobility Overal bed mobility: Needs Assistance Bed Mobility: Rolling;Sidelying to Sit Rolling: Supervision Sidelying to sit: Supervision General bed mobility comments: VC's for log rolling technique but overall supervision for bed mobility. pt compliant with precuations using this technique Transfers Overall transfer level: Needs assistance Equipment used: Rolling walker  (2 wheeled) Transfers: Sit to/from Stand Sit to Stand: Supervision General transfer comment: Supervision with verbal cues for sit<>stand for hand placement. Pt sit>stand from lowest bed setting with difficulty but follows precautions. Recommended pt to not sit on low level surfaces at home. States bed is elevated. Ambulation/Gait Ambulation/Gait assistance: Supervision Ambulation Distance (Feet): 150 Feet Assistive device: Rolling walker (2 wheeled) Gait Pattern/deviations: Step-through pattern General Gait Details: Pt ambulates well, seems a little aprehensive however is safely controling RW and does not need cues for placement or body position. Stairs: Yes Stairs assistance: Supervision Stair Management: Two rails;Step to pattern;Forwards Number of Stairs: 3 General stair comments: Safely navigates 4" steps similar to home environment - demonstrates ability to perform with supervision and has no questions/concerns    Exercises General Exercises - Lower Extremity Ankle Circles/Pumps: AROM;10 reps;Both;Seated Long Arc Quad: AROM;Both;5 reps;Seated   PT Diagnosis:    PT Problem List:   PT Treatment Interventions:       PT Goals(Current goals can be found in the care plan section) Acute Rehab PT Goals Patient Stated Goal: Go home PT Goal Formulation: With patient Time For Goal Achievement: 08/06/13 Potential to Achieve Goals: Good  Visit Information  Last PT Received On: 07/30/13 Assistance Needed: +1 History of Present Illness: Pt s/p posterior lumbar fusion 1 level for back pain       Prior Functioning  Home Living Family/patient expects to be discharged to:: Private residence Living Arrangements: Spouse/significant other;Children Available Help at Discharge: Family;Available 24 hours/day Type of Home: House Home Access: Stairs to enter [employer] of Steps: 1 small step Entrance Stairs-Rails: Left;Right;Can reach both Home Layout: One level Home Equipment:  None Prior Function Level of Independence: Independent Communication Communication: No difficulties Dominant Hand: Right    Cognition  Cognition Arousal/Alertness: Awake/alert Behavior During Therapy: WFL for tasks assessed/performed Overall Cognitive Status: Within Functional Limits for tasks assessed    Extremity/Trunk Assessment Upper Extremity Assessment Upper Extremity Assessment: Overall WFL for tasks assessed Lower Extremity Assessment Lower Extremity Assessment: Overall WFL for tasks assessed Cervical / Trunk Assessment Cervical / Trunk Assessment: Normal   Balance Balance Overall balance assessment: Needs assistance Sitting-balance support: No upper extremity supported Sitting balance-Leahy Scale: Fair Sitting balance - Comments: sits EOB without assist and dons spinal brace Standing balance support: No upper extremity supported Standing balance-Leahy Scale: Fair Standing balance comment: Brief period standing without UE support when transitioning to steps  End of Session PT - End of Session Equipment Utilized During Treatment: Gait belt;Back brace Activity Tolerance: Patient tolerated treatment well Patient left: in chair;with family/visitor present Nurse Communication: Mobility status  GP    [employer] [place], [place] [phone]  Berton Mount 07/30/2013, 1:19 PM

## 2013-07-30 NOTE — Progress Notes (Signed)
Occupational Therapy Evaluation and Discharge Patient Details Name: Travis Simpson MRN: [id number] DOB: 07-25-1965 Today's Date: 07/30/2013 Time: 9629-5284 OT Time Calculation (min): 24 min  OT Assessment / Plan / Recommendation History of present illness Pt s/p posterior lumbar fusion 1 level for back pain   Clinical Impression   PTA pt lived at home with wife and children and was Independent in ADLs and mobility. Pt required min A for transfers due to deconditioning and weakness but improved over the course of the session. Education and training completed regarding compensatory techniques for LB dressing, back precautions, and energy conservation. Pt performed tub transfer with min guard with wife present. No further acute OT needs at this time.     OT Assessment  Patient does not need any further OT services    Follow Up Recommendations  No OT follow up;Supervision/Assistance - 24 hour       Equipment Recommendations  None recommended by OT          Precautions / Restrictions Precautions Precautions: Back Precaution Booklet Issued: Yes (comment) Required Braces or Orthoses: Spinal Brace Spinal Brace: Lumbar corset;Applied in sitting position Restrictions Weight Bearing Restrictions: No   Pertinent Vitals/Pain Pt with no c/o pain.     ADL  Eating/Feeding: Independent Where Assessed - Eating/Feeding: Chair Grooming: Independent Where Assessed - Grooming: Supported standing Upper Body Bathing: Set up;Supervision/safety Where Assessed - Upper Body Bathing: Unsupported sitting Lower Body Bathing: Minimal assistance (for feet) Where Assessed - Lower Body Bathing: Supported sit to stand Upper Body Dressing: Set up;Supervision/safety Where Assessed - Upper Body Dressing: Unsupported sitting Lower Body Dressing: Moderate assistance Where Assessed - Lower Body Dressing: Supported sit to Pharmacist, hospital: Minimal Dentist Method: Sit to Teacher, music:  (sit<>stand bed to recliner) Toileting - Clothing Manipulation and Hygiene: Supervision/safety (for back precautions) Where Assessed - Glass blower/designer Manipulation and Hygiene: Standing Tub/Shower Transfer: Min Psychologist, prison and probation services Method: Ambulating Equipment Used: Back brace;Rolling walker Transfers/Ambulation Related to ADLs: Pt required min A to stand due to decreased strength but improved during session.         Visit Information  Last OT Received On: 07/30/13 Assistance Needed: +1 History of Present Illness: Pt s/p posterior lumbar fusion 1 level for back pain       Prior Functioning     Home Living Family/patient expects to be discharged to:: Private residence Living Arrangements: Spouse/significant other;Children Available Help at Discharge: Family;Available 24 hours/day Type of Home: House Home Access: Stairs to enter [employer] of Steps: 1 small step Entrance Stairs-Rails: None Home Layout: One level Home Equipment: None Prior Function Level of Independence: Independent Communication Communication: No difficulties            Cognition  Cognition Arousal/Alertness: Awake/alert Behavior During Therapy: WFL for tasks assessed/performed Overall Cognitive Status: Within Functional Limits for tasks assessed    Extremity/Trunk Assessment Upper Extremity Assessment Upper Extremity Assessment: Overall WFL for tasks assessed     Mobility Bed Mobility Overal bed mobility: Needs Assistance Bed Mobility: Rolling;Sidelying to Sit Rolling: Min guard Sidelying to sit: Min guard General bed mobility comments: VC's for log rolling technique but overall Min Guard for bed mobility Transfers Overall transfer level: Needs assistance Equipment used: Rolling walker (2 wheeled) Transfers: Sit to/from Stand Sit to Stand: Min assist General transfer comment: Min A to stand due to some deconditioning/weakness but improved over  course of session. Encouraged to participate in therapies for strengthening.  End of Session OT - End of Session Equipment Utilized During Treatment: Gait belt;Rolling walker;Back brace Activity Tolerance: Patient tolerated treatment well Patient left: in chair;with call bell/phone within reach;with family/visitor present       Rae Lips [phone] 07/30/2013, 11:07 AM

## 2013-07-30 NOTE — Op Note (Signed)
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  OPERATIVE PROCEDURE:  Gill decompression of  L4-L5, right side with complete facetectomy and decompression at L4-L5.  Complete diskectomy L4- L5 with removal of disk and implantation of biomechanical intervertebral device.  This was a Titan titanium intervertebral cage size of 11 x 35 __________ L4-L5 with local bone graft.  Segmental posterior spinal fusion with instrumentation using the invasive pedicle screw fixation device.  COMPLICATIONS:  None.  FIRST ASSISTANT:  Genene Churn. Denton Meek.  HISTORY:  Jermain is a very pleasant gentleman who has been having significant back, buttock, and radicular right leg pain.  His back pain is just as horrific as his leg pain.  He has degenerative disk disease, hard disk osteophyte, posterolateral to the right with nerve compression, having failed to improve with conservative management, we elected to proceed with surgery.  All appropriate risks, benefits, and alternatives were discussed with the patient.  Consent was obtained.  OPERATIVE NOTE:  The patient was brought in the operating room, placed supine on the operating table.  After successful induction of general anesthesia and endotracheal intubation, TEDs, SCDs, and Foley were inserted.  Needles were placed in the lower extremity for an intraoperative EMG monitoring throughout the case.  The patient was turned prone onto the Wilson frame.  All bony prominences were well padded and the back was prepped and draped in a  standard fashion.  Time- out was taken to confirm the patient procedure and all other pertinent important data.  Once this was complete, since he was having significant radicular right leg pain, I elected to place the percutaneous left screws first.  Vertical incisions were made and centered over the L4 and L5 pedicles.  Jamshidi needles were advanced percutaneously down to the lateral aspect of the superior lateral aspect of the facet complex.  I then advanced the pedicle probe through the pedicle and into the vertebral body.  I confirmed trajectory in the AP and lateral planes, and I confirmed based on nerve diagnostic that there was no breach. Once the Jamshidis were in, I cannulated the pedicles and then tapped. I then placed a 50 mm 6.5 diameter screw at L4 and a 45 mm 6.5 diameter screw at L5.  I then stemmed both screws and there were no evidence of abnormal EMG activity to suggest breach.  Radiographically, the trajectory was satisfactory and appropriate.  I then went to the contralateral right hand side.  I made a small incision, centered over the L4-L5 disk space.  Sharp dissection was carried out down to the deep fascia and I incised the deep fascia.  I bluntly dissected through the paraspinal muscles to expose the L4-L5 facet complex.  I placed a Jamshidi needle over this complex and advanced  the guide, the Jamshidi, as I did on the contralateral side into the L5 pedicle and into the vertebral body.  Again, I used trajectory with AP and lateral fluoro and connected the Jamshidi needle to the neuro monitor device to __________ there was no breach.  I then repeated this at the L4-L5 level.  At this point, with both pedicles cannulated, I tapped and then placed the same sized screws at the level. These screws were headless of course were attached to the retracting blade.  I then set up the self-retaining retractor system and proceeded with the decompression.  I identified the  lamina of L4.  Using a 3- and 4-mm Kerrison performed a generous laminotomy.  I then used an osteotome to remove the bulk of the inferior L4 facet complex.  Once this was removed, I then began decompressing the lateral recess removing the overhanging osteophyte from the superior L5 facet.  I then dissected it through the ligamentum flavum and resected with a Kerrison rongeur.  I could now identify the traversing L5 nerve root towards the foramen.  I then was able to palpate the medial and superior borders of the pedicle.  I then used an osteotome to resect the remaining portion of the superior aspect of the L5 facet that was superior to that.  Once this was removed, I then removed the remaining portion of the pars with a 2- and 3-mm Kerrison. I now had the posterior lateral aspect of the thecal sac exposed and the L5 nerve root exposed and the entire L4 nerve root exposed as the pars was removed.  I could now clearly visualize the posterior lateral aspect of the disk.  I could palpate the L4 pedicle and there was no further tension on the L4 or L5 nerve roots.  Retractors were placed to protect the nerves in the thecal sac.  I incised the annulus with a 15 blade scalpel, then used a combination of pituitary rongeurs, curettes, and Kerrison rongeurs to remove the bulk of the disk material.  I then used a rasp to rasp the endplates and ensured I had bleeding subchondral bone.  Care was taken not to violate anteriorly.  With the diskectomy complete, I then trailed and elected to use a size 11 x 35 cage.  This tightened cage was obtained and packed with the local bone that I had harvested from the decompression and mixed with DBX.  I then packed the cage and malleted it to the appropriate depth.  I had excellent fixation.  It was a tight fit.  With the graft vertical, I then realigned it, so it was horizontal in the anterior third.  I did place a sheet of conform allograft bone  sheet anterior to the cage prior to putting it in to service as an sent fusion.  Once the cage was in place, I removed the kyphosis that was built into the [place] frame and allowed the spine to fall into lordosis.  I then detached the retracting blades from the pedicle screws, attached the pedicle polyaxial heads, and then measured and placed a 50 mm rod.  The locking nuts were applied and then torqued according manufacturer's recommendation.  Once all locking screws were locked in place and torqued down, I irrigated this wound copiously with normal saline.  I obtained hemostasis using bipolar electrocautery and then placed a thrombin-soaked Gelfoam patty over the exposed thecal sac.  I then before closing did check with my Lorette Ang one last time to ensure that  there was no further nerve compression.  Once this was complete, I then irrigated and closed the deep fascia with interrupted #1 Vicryl sutures, a layer of 2-0 Vicryl sutures, and 3-0 Monocryl.  I then went to the contralateral side, measured and placed the same size rod.  Once this was done, I then used a curette to disrupt the L4-L5 facet complex and then I placed the remaining portion of bone graft in the posterior lateral gutter.  I then irrigated these 2 wounds and closed in a similar fashion as I did the contralateral side.  Steri-Strips and dry dressing were applied, and the patient was extubated, transferred to PACU without incident.  At the end of the case, all needle and sponge counts were correct.  My assistant, Zonia Kief, was instrumental in assisting retraction, visualization, suction, and wound closure.     Alvy Beal, MD    DDB/MEDQ  D:  07/29/2013  T:  07/30/2013  Job:  [id number]

## 2013-07-30 NOTE — Progress Notes (Signed)
    Subjective: Procedure(s) (LRB): TLIF L4-5  (N/A) 1 Day Post-Op  Patient reports pain as 3 on 0-10 scale.  Reports decreased leg pain reports incisional back pain   Foley just removed - will monitor for spontaneous void Negative bowel movement Positive flatus Negative chest pain or shortness of breath  Objective: Vital signs in last 24 hours: Temp:  [98.2 F (36.8 C)-98.5 F (36.9 C)] 98.5 F (36.9 C) (03/19 0500) Pulse Rate:  [74-98] 74 (03/19 0500) Resp:  [13-20] 18 (03/19 0542) BP: (135-146)/(80-98) 146/94 mmHg (03/18 2018) SpO2:  [96 %-100 %] 97 % (03/19 0542) Weight:  [92.194 kg (203 lb 4 oz)] 92.194 kg (203 lb 4 oz) (03/18 1151)  Intake/Output from previous day: 03/18 0701 - 03/19 0700 In: 4645.3 [P.O.:480; I.V.:4165.3] Out: 2650 [Urine:2450; Blood:200]  Labs:  Recent Labs  07/27/13 0827  WBC 9.7  RBC 4.98  HCT 44.6  PLT 216   No results found for this basename: NA, K, CL, CO2, BUN, CREATININE, GLUCOSE, CALCIUM,  in the last 72 hours No results found for this basename: LABPT, INR,  in the last 72 hours  Physical Exam: Neurologically intact ABD soft Neurovascular intact Incision: dressing C/D/I Compartment soft  Assessment/Plan: Patient stable  xrays pending Continue mobilization with physical therapy Continue care  Advance diet Up with therapy D/C IV fluids  Venita Lickahari Byrd Rushlow, MD [facility] [phone]

## 2013-07-30 NOTE — Anesthesia Postprocedure Evaluation (Signed)
  Anesthesia Post-op Note  Patient: Travis Simpson  Procedure(s) Performed: Procedure(s): TLIF L4-5  (N/A)  Patient Location: PACU  Anesthesia Type:General  Level of Consciousness: awake, alert  and oriented  Airway and Oxygen Therapy: Patient Spontanous Breathing  Post-op Pain: moderate  Post-op Assessment: Post-op Vital signs reviewed, Patient's Cardiovascular Status Stable, Respiratory Function Stable, Patent Airway, No signs of Nausea or vomiting and Pain level controlled  Post-op Vital Signs: Reviewed and stable  Complications: No apparent anesthesia complications

## 2013-07-30 NOTE — Plan of Care (Signed)
Problem: Consults Goal: Diagnosis - Spinal Surgery Outcome: Completed/Met Date Met:  07/30/13 Thoraco/Lumbar Spine Fusion L4-L5

## 2013-07-30 NOTE — Progress Notes (Signed)
Utilization review completed.  

## 2013-07-31 ENCOUNTER — Inpatient Hospital Stay (HOSPITAL_COMMUNITY): Payer: 59

## 2013-07-31 MED ORDER — BISACODYL 10 MG RE SUPP
10.0000 mg | Freq: Once | RECTAL | Status: AC
  Administered 2013-07-31: 10 mg via RECTAL

## 2013-07-31 MED ORDER — METHOCARBAMOL 500 MG PO TABS: 500.0000 mg | ORAL_TABLET | Freq: Four times a day (QID) | ORAL | Status: DC | PRN

## 2013-07-31 MED ORDER — POLYETHYLENE GLYCOL 3350 17 G PO PACK: 17.0000 g | PACK | Freq: Every day | ORAL | Status: DC

## 2013-07-31 MED ORDER — MAGNESIUM CITRATE PO SOLN
1.0000 | Freq: Once | ORAL | Status: AC
  Administered 2013-07-31: 1 via ORAL
  Filled 2013-07-31: qty 296

## 2013-07-31 MED ORDER — DOCUSATE SODIUM 100 MG PO CAPS: 100.0000 mg | ORAL_CAPSULE | Freq: Two times a day (BID) | ORAL | Status: DC

## 2013-07-31 MED ORDER — ONDANSETRON 4 MG PO TBDP: 4.0000 mg | ORAL_TABLET | Freq: Three times a day (TID) | ORAL | Status: DC | PRN

## 2013-07-31 MED ORDER — OXYCODONE-ACETAMINOPHEN 10-325 MG PO TABS: 1.0000 | ORAL_TABLET | Freq: Four times a day (QID) | ORAL | Status: DC | PRN

## 2013-07-31 NOTE — Progress Notes (Signed)
    Subjective: Procedure(s) (LRB): TLIF L4-5  (N/A) 2 Days Post-Op  Patient reports pain as 3 on 0-10 scale.  Reports decreased leg pain reports incisional back pain   Positive void Negative bowel movement Positive flatus Negative chest pain or shortness of breath  Objective: Vital signs in last 24 hours: Temp:  [98.5 F (36.9 C)-99.2 F (37.3 C)] 99.2 F (37.3 C) (03/20 0440) Pulse Rate:  [76-79] 76 (03/20 0440) Resp:  [18] 18 (03/20 0440) BP: (131-158)/(73-87) 142/87 mmHg (03/20 0440) SpO2:  [93 %-98 %] 97 % (03/20 0440)  Intake/Output from previous day: 03/19 0701 - 03/20 0700 In: 480 [P.O.:480] Out: 2500 [Urine:2500]  Labs: No results found for this basename: WBC, RBC, HCT, PLT,  in the last 72 hours No results found for this basename: NA, K, CL, CO2, BUN, CREATININE, GLUCOSE, CALCIUM,  in the last 72 hours No results found for this basename: LABPT, INR,  in the last 72 hours  Physical Exam: Neurologically intact ABD soft Neurovascular intact Intact pulses distally Incision: dressing C/D/I Compartment soft  Assessment/Plan: Patient stable  xrays satisfactory Continue mobilization with physical therapy Continue care  Advance diet Up with therapy Bowel meds for constipation Consider d/c today if positive bowel movement Continue to encourage mobilization Pain improved  Venita Lickahari Saanya Zieske, MD [facility] [phone]

## 2013-07-31 NOTE — Progress Notes (Signed)
Patient ID: Travis Simpson, male   DOB: 07/15/1965, 48 y.o.   MRN: [id number]   Spoke with RN.  Patient still has not had a bowel movement after dulcolax supp and Mag Citrate.  Will get a KUB to r/o postop ileus.

## 2013-07-31 NOTE — Progress Notes (Signed)
Pt continues to pass flatus but has not had a BM today. Pts abd is slightly distended and he has hypoactive BS times four. Pt received a supp at 1400 and a bottle of Mag Citrate at 1600. Notified Zonia KiefJames Owens PA for Dr. Shon BatonBrooks of pts condition. New order for KUB received. KUB has been completed at this time. Will call Fayrene FearingJames with results of abd film and will continue to monitor Pt. Order for D/C has been cancelled for today.

## 2013-07-31 NOTE — Progress Notes (Signed)
Patient ID: Felecia ShellingLarry E Simpson, male   DOB: 1965/12/14, 48 y.o.   MRN: [id number]   Patient seen this afternoon.  Still has not had a bowel movement.  He wants to d/c home today.  Will give dulcolax supp now.  If not successful, will try mag citrate in 1-2 hours.  Has made good progress with PT.

## 2013-07-31 NOTE — Care Management Note (Signed)
CARE MANAGEMENT NOTE 07/31/2013  Patient:  Travis Simpson,Travis Simpson   Account Number:  [id number]  Date Initiated:  07/30/2013  Documentation initiated by:  Vance PeperBRADY,Elan Mcelvain  Subjective/Objective Assessment:   48 yr old male s/p L4-5 TLIF     Action/Plan:   PT/OT  Case Manager spoke with patien and wife concerning DME needs at discharge. Per PT eval patient will not require HH.   Anticipated DC Date:  07/31/2013   Anticipated DC Plan:  HOME/SELF CARE      DC Planning Services  CM consult      PAC Choice  DURABLE MEDICAL EQUIPMENT   Choice offered to / List presented to:     DME arranged  [facility]  3-N-1      DME agency  [facility]     HH arranged  NA      HH agency  NA   Status of service:  Completed, signed off Medicare Important Message given?   (If response is "NO", the following Medicare IM given date fields will be blank) Date Medicare IM given:   Date Additional Medicare IM given:    Discharge Disposition:  HOME/SELF CARE

## 2013-07-31 NOTE — Progress Notes (Signed)
Patient spit up small-moderate amount of clear liquid after sitting up on side of bed to void.  Reports passing flatus.  Continue to monitor.

## 2013-07-31 NOTE — Progress Notes (Signed)
PA-C notified of KUB results by previous RN.  No new orders except to encourage ambulation.  Patient reports small BM.  Bowel sounds are now active in all 4 quadrants.  Abdomen is distended.  Patient reports little to no flatus.  Complains of nausea, no vomiting.  Zofran given IV.  Offered assistance with ambulation, patient to call when ready. Continue to monitor.

## 2013-08-01 NOTE — Progress Notes (Signed)
Subjective: 3 Days Post-Op Procedure(s) (LRB): TLIF L4-5  (N/A) Patient reports pain as mild.  No c/o.  No bm yet, but passing gas.  Pt took mag citrate and suppository.  Objective: Vital signs in last 24 hours: Temp:  [99.3 F (37.4 C)-99.7 F (37.6 C)] 99.5 F (37.5 C) (03/21 0457) Pulse Rate:  [69-74] 69 (03/21 0457) Resp:  [18] 18 (03/21 0457) BP: (125-137)/(78-88) 125/80 mmHg (03/21 0457) SpO2:  [92 %-98 %] 97 % (03/21 0457)  Intake/Output from previous day: 03/20 0701 - 03/21 0700 In: 840 [P.O.:840] Out: 1150 [Urine:1150] Intake/Output this shift:    No results found for this basename: HGB,  in the last 72 hours No results found for this basename: WBC, RBC, HCT, PLT,  in the last 72 hours No results found for this basename: NA, K, CL, CO2, BUN, CREATININE, GLUCOSE, CALCIUM,  in the last 72 hours No results found for this basename: LABPT, INR,  in the last 72 hours  PE:  wn wd male in nad.  NVI at [facility].    Assessment/Plan: 3 Days Post-Op Procedure(s) (LRB): TLIF L4-5  (N/A) Home today.  Awaiting BM.  Travis Simpson 08/01/2013, 9:00 AM

## 2013-08-01 NOTE — Progress Notes (Signed)
Patient up and ambulating in halls.  Reports abdominal discomfort has resolved and passing gas, slept well during the night.

## 2013-08-17 NOTE — Discharge Summary (Signed)
Patient ID: Travis Simpson MRN: [id number] DOB/AGE: 49-Jan-1967 5 y.o.  Admit date: 07/29/2013 Discharge date: 08/17/2013  Admission Diagnoses:  Active Problems:   Back pain   Discharge Diagnoses:  Active Problems:   Back pain  status post Procedure(s): TLIF L4-5   Past Medical History  Diagnosis Date  . Weakness     tingling in right leg  . Chronic back pain     herniated disc    Surgeries: Procedure(s): TLIF L4-5  on 07/29/2013   Consultants:    Discharged Condition: Improved  Hospital Course: Travis Simpson is an 48 y.o. male who was admitted 07/29/2013 for operative treatment of L4-5 ddd. Patient failed conservative treatments (please see the history and physical for the specifics) and had severe unremitting pain that affects sleep, daily activities and work/hobbies. After pre-op clearance, the patient was taken to the operating room on 07/29/2013 and underwent  Procedure(s): TLIF L4-5 .    Patient was given perioperative antibiotics:  Anti-infectives   Start     Dose/Rate Route Frequency Ordered Stop   07/29/13 2200  ceFAZolin (ANCEF) IVPB 1 g/50 mL premix     1 g 100 mL/hr over 30 Minutes Intravenous Every 8 hours 07/29/13 2015 07/30/13 0650   07/29/13 1355  ceFAZolin (ANCEF) 2-3 GM-% IVPB SOLR    Comments:  Travis Simpson   : cabinet override      07/29/13 1355 07/29/13 1445       Patient was given sequential compression devices and early ambulation to prevent DVT.   Patient benefited maximally from hospital stay and there were no complications. At the time of discharge, the patient was urinating/moving their bowels without difficulty, tolerating a regular diet, pain is controlled with oral pain medications and they have been cleared by PT/OT.   Recent vital signs: No data found.    Recent laboratory studies: No results found for this basename: WBC, HGB, HCT, PLT, NA, K, CL, CO2, BUN, CREATININE, GLUCOSE, PT, INR, CALCIUM, 2,  in the last 72  hours   Discharge Medications:     Medication List    STOP taking these medications       HYDROcodone-acetaminophen 7.5-325 MG per tablet  Commonly known as:  NORCO     multivitamin with minerals Tabs tablet     traMADol 50 MG tablet  Commonly known as:  ULTRAM      TAKE these medications       docusate sodium 100 MG capsule  Commonly known as:  COLACE  Take 1 capsule (100 mg total) by mouth 2 (two) times daily.     methocarbamol 500 MG tablet  Commonly known as:  ROBAXIN  Take 1 tablet (500 mg total) by mouth every 6 (six) hours as needed for muscle spasms.     ondansetron 4 MG disintegrating tablet  Commonly known as:  ZOFRAN ODT  Take 1 tablet (4 mg total) by mouth every 8 (eight) hours as needed for nausea or vomiting.     oxyCODONE-acetaminophen 10-325 MG per tablet  Commonly known as:  PERCOCET  Take 1-2 tablets by mouth every 6 (six) hours as needed for pain.     polyethylene glycol packet  Commonly known as:  MIRALAX / GLYCOLAX  Take 17 g by mouth daily.        Diagnostic Studies: Dg Lumbar Spine 2-3 Views  07/30/2013   CLINICAL DATA:  s/p spine fusion  EXAM: LUMBAR SPINE - 2-3 VIEW  COMPARISON:  DG  C-ARM GT 120 MIN dated 07/29/2013  FINDINGS: Patient is status post posterior fusion of L4-5. Pedicle screws and fixation rods are intact. Intervertebral disc prostheses is appreciated and appears unremarkable. The osseous structures demonstrate no focal or acute abnormalities.  IMPRESSION: Patient status post L4-5 spinal fusion. No focal acute abnormalities appreciated.   Electronically Signed   By: Salome Holmes M.D.   On: 07/30/2013 10:47   Dg Lumbar Spine 2-3 Views  07/29/2013   CLINICAL DATA:  L4-5 disc herniation.  EXAM: LUMBAR SPINE - 2-3 VIEW; DG C-ARM GT 120 MIN  COMPARISON:  Lumbar radiographs dated 07/29/2013  FINDINGS: AP and lateral C-arm images demonstrate the patient has undergone interbody and posterior fusion at L4-5. Alignment of the vertebrae is  anatomic. There is narrowing of the L5-S1 disc space.  IMPRESSION: Fusion performed at L4-5.   Electronically Signed   By: Geanie Cooley M.D.   On: 07/29/2013 17:55   Dg Lumbar Spine 2-3 Views  07/29/2013   CLINICAL DATA:  Prior to lumbar fusion  EXAM: LUMBAR SPINE - 2-3 VIEW  COMPARISON:  None.  FINDINGS: There are 5 nonrib bearing lumbar-type vertebral bodies. The vertebral body heights are maintained. The alignment is anatomic. There is no spondylolysis. There is no acute fracture or static listhesis. There is degenerative disc disease at L4-5 and L5-S1.  The SI joints are unremarkable.  IMPRESSION: Degenerative disc disease at L4-5 and L5-S1.   Electronically Signed   By: Elige Ko   On: 07/29/2013 12:24   Dg Abd Portable 1v  07/31/2013   CLINICAL DATA:  Postop back surgery, question ileus  EXAM: PORTABLE ABDOMEN - 1 VIEW  COMPARISON:  None  FINDINGS: Air-filled large and small bowel loops throughout abdomen, colon minimally prominent, could represent postoperative ileus.  Stool present throughout right colon.  Prior L4-L5 fusion.  Osseous mineralization normal.  No bowel wall thickening or urinary tract calcification.  IMPRESSION: Question postoperative ileus.   Electronically Signed   By: Ulyses Southward M.D.   On: 07/31/2013 18:45   Dg C-arm Gt 120 Min  07/29/2013   CLINICAL DATA:  L4-5 disc herniation.  EXAM: LUMBAR SPINE - 2-3 VIEW; DG C-ARM GT 120 MIN  COMPARISON:  Lumbar radiographs dated 07/29/2013  FINDINGS: AP and lateral C-arm images demonstrate the patient has undergone interbody and posterior fusion at L4-5. Alignment of the vertebrae is anatomic. There is narrowing of the L5-S1 disc space.  IMPRESSION: Fusion performed at L4-5.   Electronically Signed   By: Geanie Cooley M.D.   On: 07/29/2013 17:55        Discharge Orders   Future Orders Complete By Expires   Call MD / Call 911  As directed    Comments:     If you experience chest pain or shortness of breath, CALL 911 and be  transported to the hospital emergency room.  If you develope a fever above 101 F, pus (white drainage) or increased drainage or redness at the wound, or calf pain, call your surgeon's office.   Constipation Prevention  As directed    Comments:     Drink plenty of fluids.  Prune juice may be helpful.  You may use a stool softener, such as Colace (over the counter) 100 mg twice a day.  Use MiraLax (over the counter) for constipation as needed.   Diet - low sodium heart healthy  As directed    Increase activity slowly as tolerated  As directed  Follow-up Information   Schedule an appointment as soon as possible for a visit with Alvy Beal, MD. (need return office visit 2 weeks postop)    Specialty:  Orthopedic Surgery   Contact information:   [address] [place] [address] [phone]       Discharge Plan:  discharge to home      Signed: Naida Sleight for Dr. Venita Lick [facility] [phone] 08/17/2013, 2:00 PM

## 2013-08-18 NOTE — Discharge Summary (Signed)
Agree with above 

## 2014-03-05 ENCOUNTER — Telehealth: Payer: Self-pay | Admitting: Genetic Counselor

## 2014-03-09 ENCOUNTER — Telehealth: Payer: Self-pay | Admitting: Hematology and Oncology

## 2014-03-09 NOTE — Telephone Encounter (Signed)
S/W PATIENT AND GAVE NP APPT FOR 11/05 @ 2 W/DR. GORSUCH °

## 2014-03-12 NOTE — Telephone Encounter (Signed)
S/W PATIENT AND GAVE NP APPT FOR 11/05 @ 2 W/DR. GORSUCH

## 2014-03-18 ENCOUNTER — Encounter: Payer: Self-pay | Admitting: Hematology and Oncology

## 2014-03-18 ENCOUNTER — Ambulatory Visit: Payer: 59

## 2014-03-18 ENCOUNTER — Encounter (INDEPENDENT_AMBULATORY_CARE_PROVIDER_SITE_OTHER): Payer: Self-pay

## 2014-03-18 ENCOUNTER — Ambulatory Visit (HOSPITAL_BASED_OUTPATIENT_CLINIC_OR_DEPARTMENT_OTHER): Payer: 59 | Admitting: Hematology and Oncology

## 2014-03-18 VITALS — BP 126/78 | HR 79 | Temp 98.3°F | Resp 18 | Ht 74.0 in | Wt 193.0 lb

## 2014-03-18 DIAGNOSIS — Z9189 Other specified personal risk factors, not elsewhere classified: Secondary | ICD-10-CM

## 2014-03-18 DIAGNOSIS — F17200 Nicotine dependence, unspecified, uncomplicated: Secondary | ICD-10-CM

## 2014-03-18 DIAGNOSIS — D72829 Elevated white blood cell count, unspecified: Secondary | ICD-10-CM | POA: Insufficient documentation

## 2014-03-18 DIAGNOSIS — Z720 Tobacco use: Secondary | ICD-10-CM

## 2014-03-18 DIAGNOSIS — K088 Other specified disorders of teeth and supporting structures: Secondary | ICD-10-CM

## 2014-03-18 NOTE — Progress Notes (Signed)
Checked in new pt with no financial concerns. °

## 2014-03-18 NOTE — Assessment & Plan Note (Addendum)
This is likely reactive in nature related to his poor dentition and sore tooth. I recommend he sees a dentist. I also recommend the patient to stop smoking. He has appointment to see his primary care provider next year and I recommend not to have his blood work rechecked until his bad dentitian has resolved and the patient has quit smoking. I will be happy to see him again in the future if his leukocytosis does not resolve next year.

## 2014-03-18 NOTE — Progress Notes (Signed)
[facility] CONSULT NOTE  Patient Care Team: Candace Darolyn Ruahiele Smith, MD as PCP - General (Family Medicine) Artis DelayNi Korianna Washer, MD as Consulting Physician (Hematology and Oncology)  CHIEF COMPLAINTS/PURPOSE OF CONSULTATION:  Leukocytosis  HISTORY OF PRESENTING ILLNESS:  Travis Simpson 48 y.o. male is here because of elevated WBC.  He was found to have abnormal CBC from routine blood work by PCP. His recent blood work in March 2015 is normal. Since then, it was elevated from 11.1 to 13.3 He denies recent infection. The last prescription antibiotics was more than 3 months ago There is not reported symptoms of sinus congestion, cough, urinary frequency/urgency or dysuria, diarrhea, joint swelling/pain or abnormal skin rash. He complained of a sore tooth that has been causing mild jaw pain. He had no prior history or diagnosis of cancer. His age appropriate screening programs are up-to-date. The patient has no prior diagnosis of autoimmune disease and was not prescribed corticosteroids related products.  The patient is a smoker and currently smokes a few cigarettes per day for the last 20+ years.  MEDICAL HISTORY:  Past Medical History  Diagnosis Date  . Weakness     tingling in right leg  . Chronic back pain     herniated disc    SURGICAL HISTORY: Past Surgical History  Procedure Laterality Date  . Knee arthroscopy Left 1981  . Anterior cervical decomp/discectomy fusion  07/2008  . Posterior lumbar fusion  07/29/2013    SOCIAL HISTORY: History   Social History  . Marital Status: Married    Spouse Name: N/A    Number of Children: N/A  . Years of Education: N/A   Occupational History  . Not on file.   Social History Main Topics  . Smoking status: Current Some Day Smoker    Types: Cigars  . Smokeless tobacco: Never Used  . Alcohol Use: Yes     Comment: 07/30/2013 "might drink a beer or 2 on special occasions"  . Drug Use: No  . Sexual Activity: Yes   Other  Topics Concern  . Not on file   Social History Narrative    FAMILY HISTORY: History reviewed. No pertinent family history.  ALLERGIES:  has No Known Allergies.  MEDICATIONS:  Current Outpatient Prescriptions  Medication Sig Dispense Refill  . naproxen sodium (ANAPROX) 220 MG tablet Take 220 mg by mouth 2 (two) times daily with a meal.    . oxyCODONE-acetaminophen (PERCOCET) 10-325 MG per tablet Take 1-2 tablets by mouth every 6 (six) hours as needed for pain. 60 tablet 0  . traMADol (ULTRAM) 50 MG tablet Take by mouth every 8 (eight) hours as needed.     No current facility-administered medications for this visit.    REVIEW OF SYSTEMS:   Constitutional: Denies fevers, chills or abnormal night sweats Eyes: Denies blurriness of vision, double vision or watery eyes Ears, nose, mouth, throat, and face: Denies mucositis or sore throat Respiratory: Denies cough, dyspnea or wheezes Cardiovascular: Denies palpitation, chest discomfort or lower extremity swelling Gastrointestinal:  Denies nausea, heartburn or change in bowel habits Skin: Denies abnormal skin rashes Lymphatics: Denies new lymphadenopathy or easy bruising Neurological:Denies numbness, tingling or new weaknesses Behavioral/Psych: Mood is stable, no new changes  All other systems were reviewed with the patient and are negative.  PHYSICAL EXAMINATION: ECOG PERFORMANCE STATUS: 1 - Symptomatic but completely ambulatory  Filed Vitals:   03/18/14 1356  BP: 126/78  Pulse: 79  Temp: 98.3 F (36.8 C)  Resp: 18  Filed Weights   03/18/14 1356  Weight: 193 lb (87.544 kg)    GENERAL:alert, no distress and comfortable SKIN: skin color, texture, turgor are normal, no rashes or significant lesions EYES: normal, conjunctiva are pink and non-injected, sclera clear OROPHARYNX:no exudate, no erythema and lips, buccal mucosa, and tongue normal. Poor dentitian is noted NECK: supple, thyroid normal size, non-tender, without  nodularity LYMPH:  no palpable lymphadenopathy in the cervical, axillary or inguinal LUNGS: clear to auscultation and percussion with normal breathing effort HEART: regular rate & rhythm and no murmurs and no lower extremity edema ABDOMEN:abdomen soft, non-tender and normal bowel sounds Musculoskeletal:no cyanosis of digits and no clubbing  PSYCH: alert & oriented x 3 with fluent speech NEURO: no focal motor/sensory deficits  LABORATORY DATA:  I have reviewed the data as listed ASSESSMENT & PLAN Leukocytosis This is likely reactive in nature related to his poor dentition and sore tooth. I recommend he sees a dentist. I also recommend the patient to stop smoking. He has appointment to see his primary care provider next year and I recommend not to have his blood work rechecked until his bad dentitian has resolved and the patient has quit smoking. I will be happy to see him again in the future if his leukocytosis does not resolve next year.  Smoker I spent some time counseling the patient the importance of tobacco cessation. he is currently attempting to quit on his own  Oral hygiene poor I recommend urgent dental evaluation

## 2014-03-18 NOTE — Assessment & Plan Note (Signed)
I spent some time counseling the patient the importance of tobacco cessation. he is currently attempting to quit on his own 

## 2014-03-18 NOTE — Assessment & Plan Note (Signed)
I recommend urgent dental evaluation

## 2014-05-14 HISTORY — PX: COLONOSCOPY: SHX174

## 2015-02-13 IMAGING — CR DG ABD PORTABLE 1V
1 series · 1 of 1 positions shown · non-contrast
Comparison: None

CLINICAL DATA: Postop back surgery, question ileus

EXAM:
PORTABLE ABDOMEN - 1 VIEW

[AP]
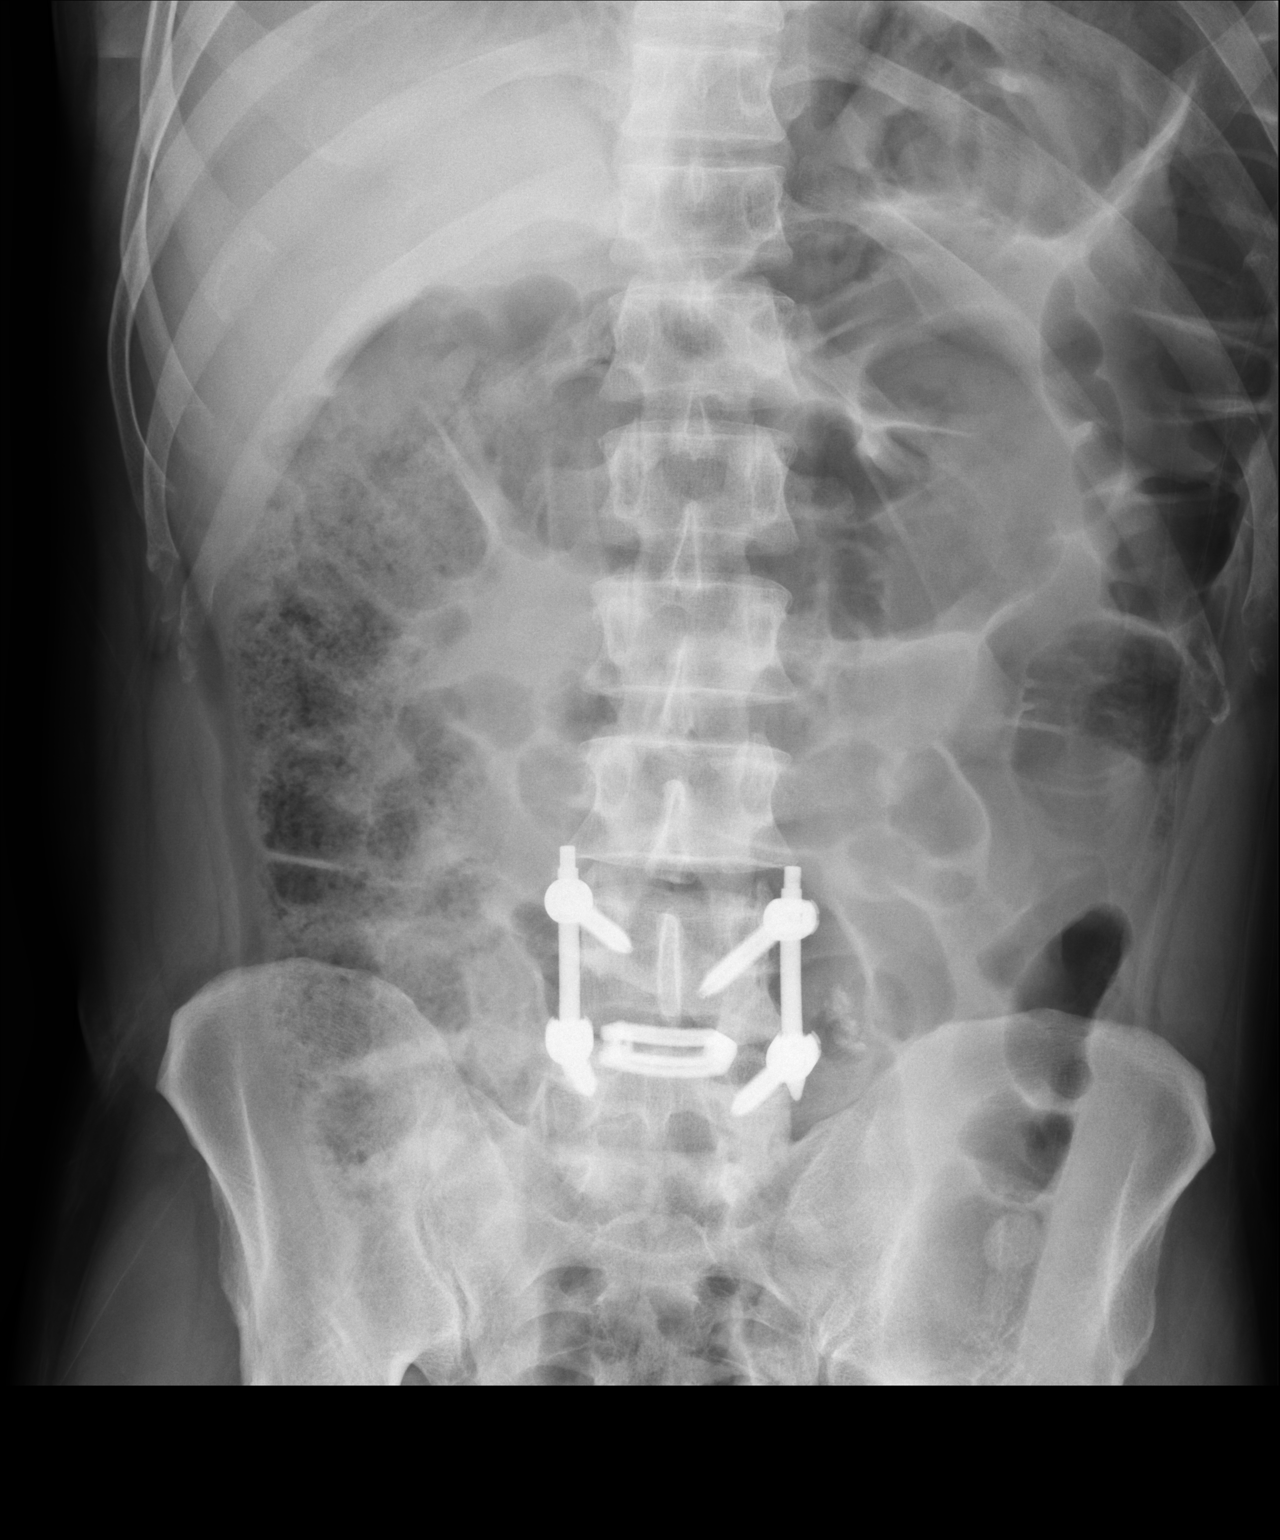

[1 of 1 positions shown; findings below may reference images not displayed]

FINDINGS: Air-filled large and small bowel loops throughout abdomen, colon
minimally prominent, could represent postoperative ileus.

Stool present throughout right colon.

Prior L4-L5 fusion.

Osseous mineralization normal.

No bowel wall thickening or urinary tract calcification.
IMPRESSION: Question postoperative ileus.

## 2016-06-14 DIAGNOSIS — D72829 Elevated white blood cell count, unspecified: Secondary | ICD-10-CM | POA: Diagnosis not present

## 2016-06-14 DIAGNOSIS — Z23 Encounter for immunization: Secondary | ICD-10-CM | POA: Diagnosis not present

## 2016-07-05 ENCOUNTER — Telehealth: Payer: Self-pay | Admitting: Hematology and Oncology

## 2016-07-05 NOTE — Telephone Encounter (Signed)
sw pt to confirm 3/26 appt date/time per LOS.  pt needed late afternoon appt due to work schedule.

## 2016-07-23 ENCOUNTER — Encounter: Payer: Self-pay | Admitting: Hematology and Oncology

## 2016-07-23 ENCOUNTER — Ambulatory Visit: Payer: Self-pay | Admitting: Hematology and Oncology

## 2016-08-06 ENCOUNTER — Encounter: Payer: Self-pay | Admitting: Hematology and Oncology

## 2016-08-06 ENCOUNTER — Ambulatory Visit: Payer: 59 | Admitting: Hematology and Oncology

## 2016-08-06 ENCOUNTER — Other Ambulatory Visit: Payer: 59

## 2016-08-06 ENCOUNTER — Other Ambulatory Visit: Payer: Self-pay | Admitting: Hematology and Oncology

## 2016-08-06 DIAGNOSIS — D72829 Elevated white blood cell count, unspecified: Secondary | ICD-10-CM

## 2016-08-09 ENCOUNTER — Telehealth: Payer: Self-pay | Admitting: Hematology and Oncology

## 2016-08-09 NOTE — Telephone Encounter (Signed)
Pt called to r/s follow up appt. Gave pt next available appt date/time 4/3 at 10 am

## 2016-08-14 ENCOUNTER — Encounter: Payer: Self-pay | Admitting: Hematology and Oncology

## 2016-08-14 ENCOUNTER — Other Ambulatory Visit (HOSPITAL_BASED_OUTPATIENT_CLINIC_OR_DEPARTMENT_OTHER): Payer: 59

## 2016-08-14 ENCOUNTER — Ambulatory Visit (HOSPITAL_BASED_OUTPATIENT_CLINIC_OR_DEPARTMENT_OTHER): Payer: 59 | Admitting: Hematology and Oncology

## 2016-08-14 DIAGNOSIS — D72829 Elevated white blood cell count, unspecified: Secondary | ICD-10-CM

## 2016-08-14 DIAGNOSIS — F17200 Nicotine dependence, unspecified, uncomplicated: Secondary | ICD-10-CM | POA: Diagnosis not present

## 2016-08-14 LAB — CBC WITH DIFFERENTIAL/PLATELET
BASO%: 0.3 % (ref 0.0–2.0)
BASOS ABS: 0 10*3/uL (ref 0.0–0.1)
EOS%: 1.8 % (ref 0.0–7.0)
Eosinophils Absolute: 0.2 10*3/uL (ref 0.0–0.5)
HCT: 45.2 % (ref 38.4–49.9)
HEMOGLOBIN: 14.9 g/dL (ref 13.0–17.1)
LYMPH%: 34.3 % (ref 14.0–49.0)
MCH: 29.7 pg (ref 27.2–33.4)
MCHC: 33 g/dL (ref 32.0–36.0)
MCV: 90.2 fL (ref 79.3–98.0)
MONO#: 0.7 10*3/uL (ref 0.1–0.9)
MONO%: 7.8 % (ref 0.0–14.0)
NEUT#: 5.2 10*3/uL (ref 1.5–6.5)
NEUT%: 55.8 % (ref 39.0–75.0)
Platelets: 224 10*3/uL (ref 140–400)
RBC: 5.01 10*6/uL (ref 4.20–5.82)
RDW: 13.8 % (ref 11.0–14.6)
WBC: 9.3 10*3/uL (ref 4.0–10.3)
lymph#: 3.2 10*3/uL (ref 0.9–3.3)

## 2016-08-14 LAB — CHCC SMEAR

## 2016-08-14 NOTE — Progress Notes (Signed)
[facility] NOTE  Reginia Naas, MD SUMMARY OF HEMATOLOGIC HISTORY: Travis Simpson was referred here because of elevated WBC.  He was found to have abnormal CBC from routine blood work by PCP. His recent blood work in March 2015 is normal. Since then, it was elevated from 11.1 to 13.3 The patient is a smoker and currently smokes a few cigar per day for the last 20+ years. In November 2015, repeat CBC came back normal.  He was discharged INTERVAL HISTORY: Travis Simpson 51 y.o. male returns for follow-up According to scanned reports, he had chronic leukocytosis since he was last seen here with white count typically ranging around 11-12 He also have significant dental issues and had recent dental extraction He continues to smoke cigars He denies a need for antibiotic therapy recently He denies recent cough, skin rashes or sinusitis  I have reviewed the past medical history, past surgical history, social history and family history with the patient and they are unchanged from previous note.  ALLERGIES:  has No Known Allergies.  MEDICATIONS:  Current Outpatient Prescriptions  Medication Sig Dispense Refill  . ibuprofen (ADVIL,MOTRIN) 200 MG tablet Take 200 mg by mouth every 6 (six) hours as needed.    Marland Kitchen VIAGRA 100 MG tablet TK 1 T PO PRN 30 MIN PRIOR TO NEED  4   No current facility-administered medications for this visit.      REVIEW OF SYSTEMS:   Constitutional: Denies fevers, chills or night sweats Eyes: Denies blurriness of vision Ears, nose, mouth, throat, and face: Denies mucositis or sore throat Respiratory: Denies cough, dyspnea or wheezes Cardiovascular: Denies palpitation, chest discomfort or lower extremity swelling Gastrointestinal:  Denies nausea, heartburn or change in bowel habits Skin: Denies abnormal skin rashes Lymphatics: Denies new lymphadenopathy or easy bruising Neurological:Denies numbness, tingling or new  weaknesses Behavioral/Psych: Mood is stable, no new changes  All other systems were reviewed with the patient and are negative.  PHYSICAL EXAMINATION: ECOG PERFORMANCE STATUS: 0 - Asymptomatic  Vitals:   08/14/16 0952  BP: 124/82  Pulse: 80  Resp: 18  Temp: 97.6 F (36.4 C)   Filed Weights   08/14/16 0952  Weight: 184 lb 3.2 oz (83.6 kg)    GENERAL:alert, no distress and comfortable SKIN: skin color, texture, turgor are normal, no rashes or significant lesions EYES: normal, Conjunctiva are pink and non-injected, sclera clear OROPHARYNX:no exudate, no erythema and lips, buccal mucosa, and tongue normal  NECK: supple, thyroid normal size, non-tender, without nodularity LYMPH:  no palpable lymphadenopathy in the cervical, axillary or inguinal LUNGS: clear to auscultation and percussion with normal breathing effort HEART: regular rate & rhythm and no murmurs and no lower extremity edema ABDOMEN:abdomen soft, non-tender and normal bowel sounds Musculoskeletal:no cyanosis of digits and no clubbing  NEURO: alert & oriented x 3 with fluent speech, no focal motor/sensory deficits  LABORATORY DATA:  I have reviewed the data as listed     Component Value Date/Time   NA 139 07/23/2008 0455   K 3.7 07/23/2008 0455   CL 105 07/23/2008 0455   CO2 27 07/23/2008 0455   GLUCOSE 89 07/23/2008 0455   BUN 9 07/23/2008 0455   CREATININE 0.93 07/23/2008 0455   CALCIUM 9.4 07/23/2008 0455   GFRNONAA >60 07/23/2008 0455   GFRAA  07/23/2008 0455    >60        The eGFR has been calculated using the MDRD equation. This calculation has not been validated in  all clinical situations. eGFR's persistently <60 mL/min signify possible Chronic Kidney Disease.    No results found for: SPEP, UPEP  Lab Results  Component Value Date   WBC 9.3 08/14/2016   NEUTROABS 5.2 08/14/2016   HGB 14.9 08/14/2016   HCT 45.2 08/14/2016   MCV 90.2 08/14/2016   PLT 224 08/14/2016      Chemistry       Component Value Date/Time   NA 139 07/23/2008 0455   K 3.7 07/23/2008 0455   CL 105 07/23/2008 0455   CO2 27 07/23/2008 0455   BUN 9 07/23/2008 0455   CREATININE 0.93 07/23/2008 0455      Component Value Date/Time   CALCIUM 9.4 07/23/2008 0455       ASSESSMENT & PLAN:  Leukocytosis The patient has intermittent leukocytosis in the past, likely reactive in nature Repeat CBC again show normal white blood cell count He had recent dental work which I suspect could be the trigger for leukocytosis I have not made a return appointment for the patient to come back  Smoker I spent some time counseling the patient the importance of tobacco cessation. We discussed common strategies including nicotine patches, Tobacco Quit-line, and other nicotine replacement products to assist in hiseffort to quit  he appears motivated to quit.    No orders of the defined types were placed in this encounter.   All questions were answered. The patient knows to call the clinic with any problems, questions or concerns. No barriers to learning was detected.  I spent 15 minutes counseling the patient face to face. The total time spent in the appointment was 20 minutes and more than 50% was on counseling.     Heath Lark, MD 4/3/201810:38 AM

## 2016-08-14 NOTE — Assessment & Plan Note (Signed)
I spent some time counseling the patient the importance of tobacco cessation. We discussed common strategies including nicotine patches, Tobacco Quit-line, and other nicotine replacement products to assist in hiseffort to quit  he appears motivated to quit.  

## 2016-08-14 NOTE — Assessment & Plan Note (Signed)
The patient has intermittent leukocytosis in the past, likely reactive in nature Repeat CBC again show normal white blood cell count He had recent dental work which I suspect could be the trigger for leukocytosis I have not made a return appointment for the patient to come back

## 2016-08-15 LAB — SEDIMENTATION RATE: SED RATE: 3 mm/h (ref 0–30)

## 2016-11-21 DIAGNOSIS — Z0000 Encounter for general adult medical examination without abnormal findings: Secondary | ICD-10-CM | POA: Diagnosis not present

## 2016-11-21 DIAGNOSIS — Z13220 Encounter for screening for lipoid disorders: Secondary | ICD-10-CM | POA: Diagnosis not present

## 2016-11-21 DIAGNOSIS — Z23 Encounter for immunization: Secondary | ICD-10-CM | POA: Diagnosis not present

## 2017-09-24 DIAGNOSIS — S39013A Strain of muscle, fascia and tendon of pelvis, initial encounter: Secondary | ICD-10-CM | POA: Diagnosis not present

## 2018-01-24 DIAGNOSIS — Z125 Encounter for screening for malignant neoplasm of prostate: Secondary | ICD-10-CM | POA: Diagnosis not present

## 2018-01-24 DIAGNOSIS — E7800 Pure hypercholesterolemia, unspecified: Secondary | ICD-10-CM | POA: Diagnosis not present

## 2018-01-24 DIAGNOSIS — Z0000 Encounter for general adult medical examination without abnormal findings: Secondary | ICD-10-CM | POA: Diagnosis not present

## 2018-01-24 DIAGNOSIS — Z23 Encounter for immunization: Secondary | ICD-10-CM | POA: Diagnosis not present

## 2020-03-23 ENCOUNTER — Other Ambulatory Visit: Payer: Self-pay | Admitting: Family Medicine

## 2020-03-23 ENCOUNTER — Other Ambulatory Visit: Payer: Self-pay

## 2020-03-23 ENCOUNTER — Other Ambulatory Visit (HOSPITAL_COMMUNITY): Payer: Self-pay | Admitting: Family Medicine

## 2020-03-23 ENCOUNTER — Ambulatory Visit
Admission: RE | Admit: 2020-03-23 | Discharge: 2020-03-23 | Disposition: A | Discharge status: Home / Self Care | Payer: 59 | Source: Ambulatory Visit | Attending: Family Medicine | Admitting: Family Medicine

## 2020-03-23 DIAGNOSIS — R1314 Dysphagia, pharyngoesophageal phase: Secondary | ICD-10-CM

## 2020-05-02 ENCOUNTER — Other Ambulatory Visit: Payer: Self-pay | Admitting: Physician Assistant

## 2020-05-02 DIAGNOSIS — R1310 Dysphagia, unspecified: Secondary | ICD-10-CM

## 2020-05-02 MED FILL — PANTOPRAZOLE SOD DR 40 MG T: 40 | 30 days supply | Qty: 30 | Fill #0

## 2020-05-11 ENCOUNTER — Other Ambulatory Visit: Payer: No Typology Code available for payment source

## 2020-08-15 ENCOUNTER — Other Ambulatory Visit (HOSPITAL_COMMUNITY): Payer: Self-pay

## 2020-08-15 MED ORDER — PANTOPRAZOLE SODIUM 40 MG PO TBEC
DELAYED_RELEASE_TABLET | ORAL | 3 refills | Status: DC
  Filled 2020-08-15: qty 30, 30d supply, fill #0
  Filled 2021-07-10: qty 30, 30d supply, fill #1

## 2021-03-29 ENCOUNTER — Other Ambulatory Visit (HOSPITAL_COMMUNITY): Payer: Self-pay

## 2021-03-29 MED ORDER — CEPHALEXIN 500 MG PO CAPS
ORAL_CAPSULE | ORAL | 0 refills | Status: DC
  Filled 2021-03-29: qty 20, 10d supply, fill #0

## 2021-07-10 ENCOUNTER — Other Ambulatory Visit (HOSPITAL_COMMUNITY): Payer: Self-pay

## 2021-10-06 IMAGING — CR DG CHEST 2V
3 series · 3 of 3 positions shown · non-contrast
Comparison: Chest x-ray 02/13/2008

CLINICAL DATA: Pharyngeoesophageal dysphagia, heartburn, hiccups,
low-grade temperature last week. COVID negative. 18 pound weight
loss over last year. Occasional smoke cigarettes.

EXAM:
CHEST - 2 VIEW

[w chest pa]
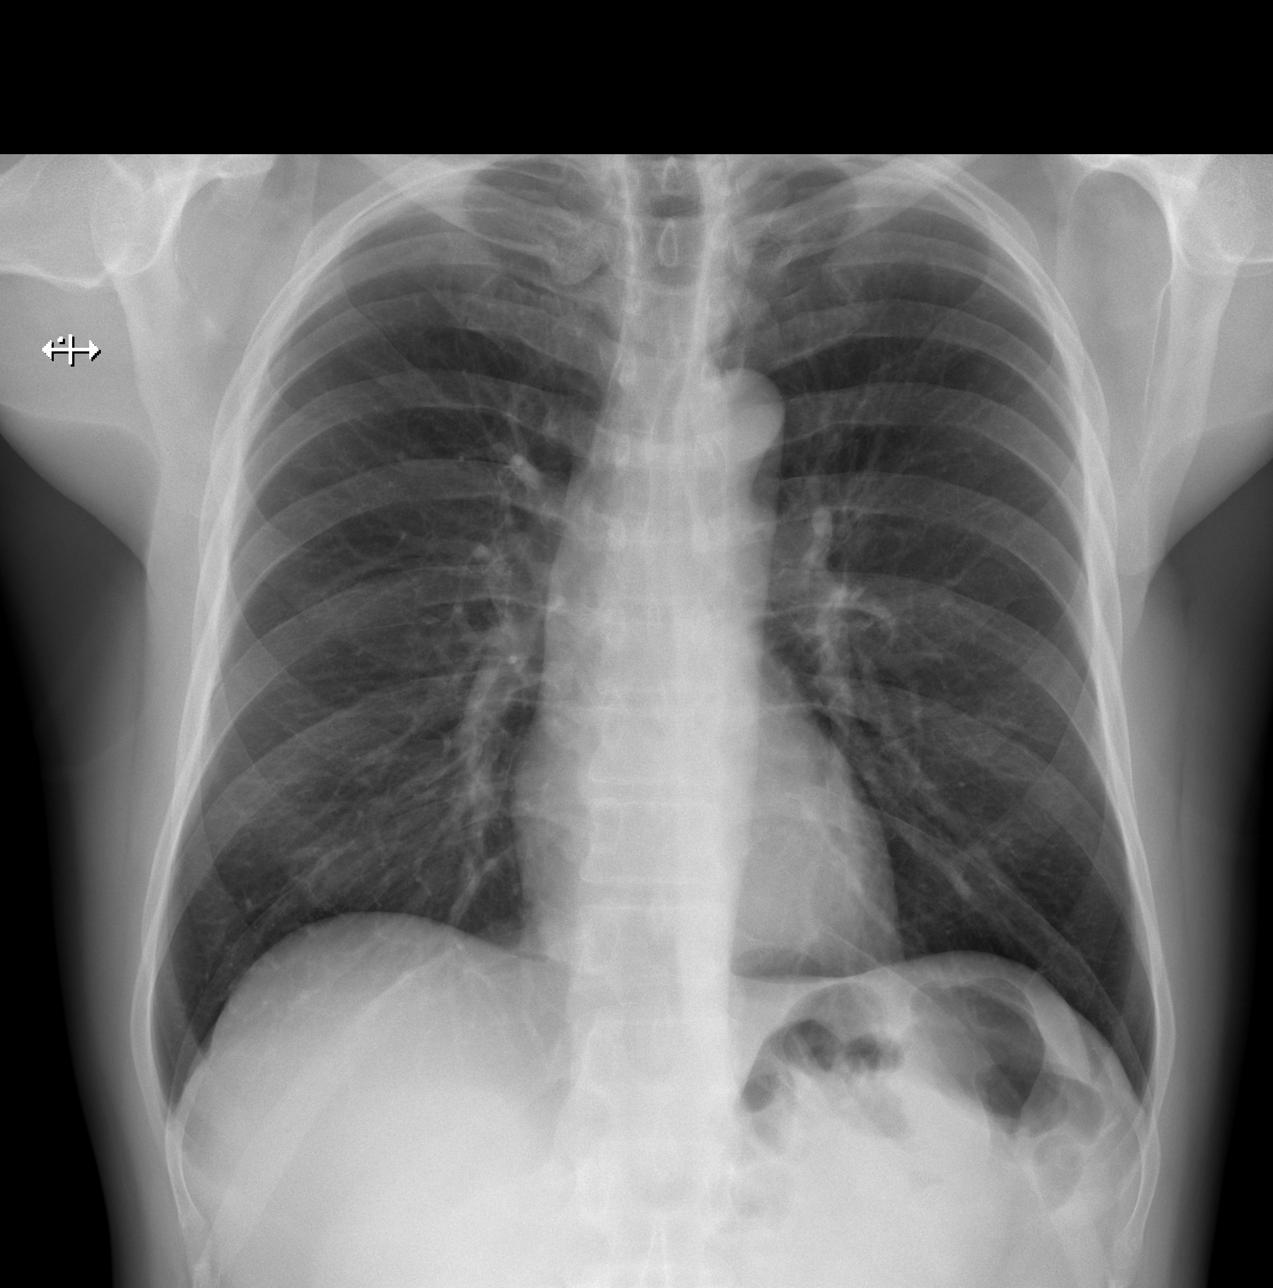

[w chest lat (1 of 2)]
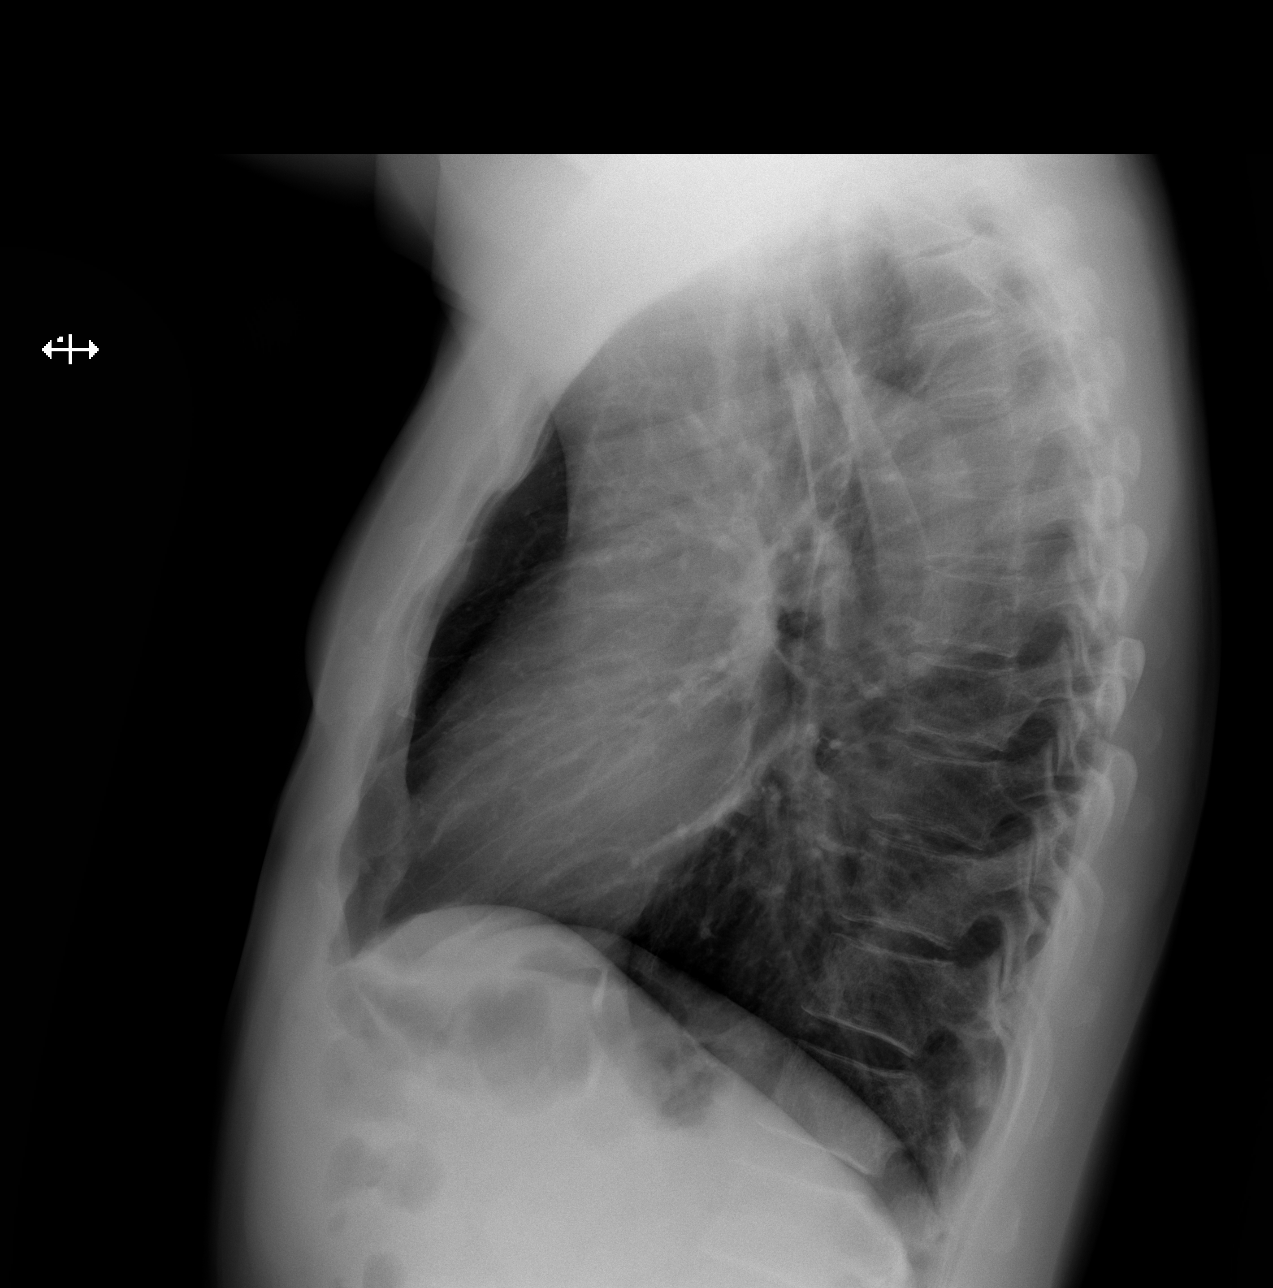

[w chest lat (2 of 2)]
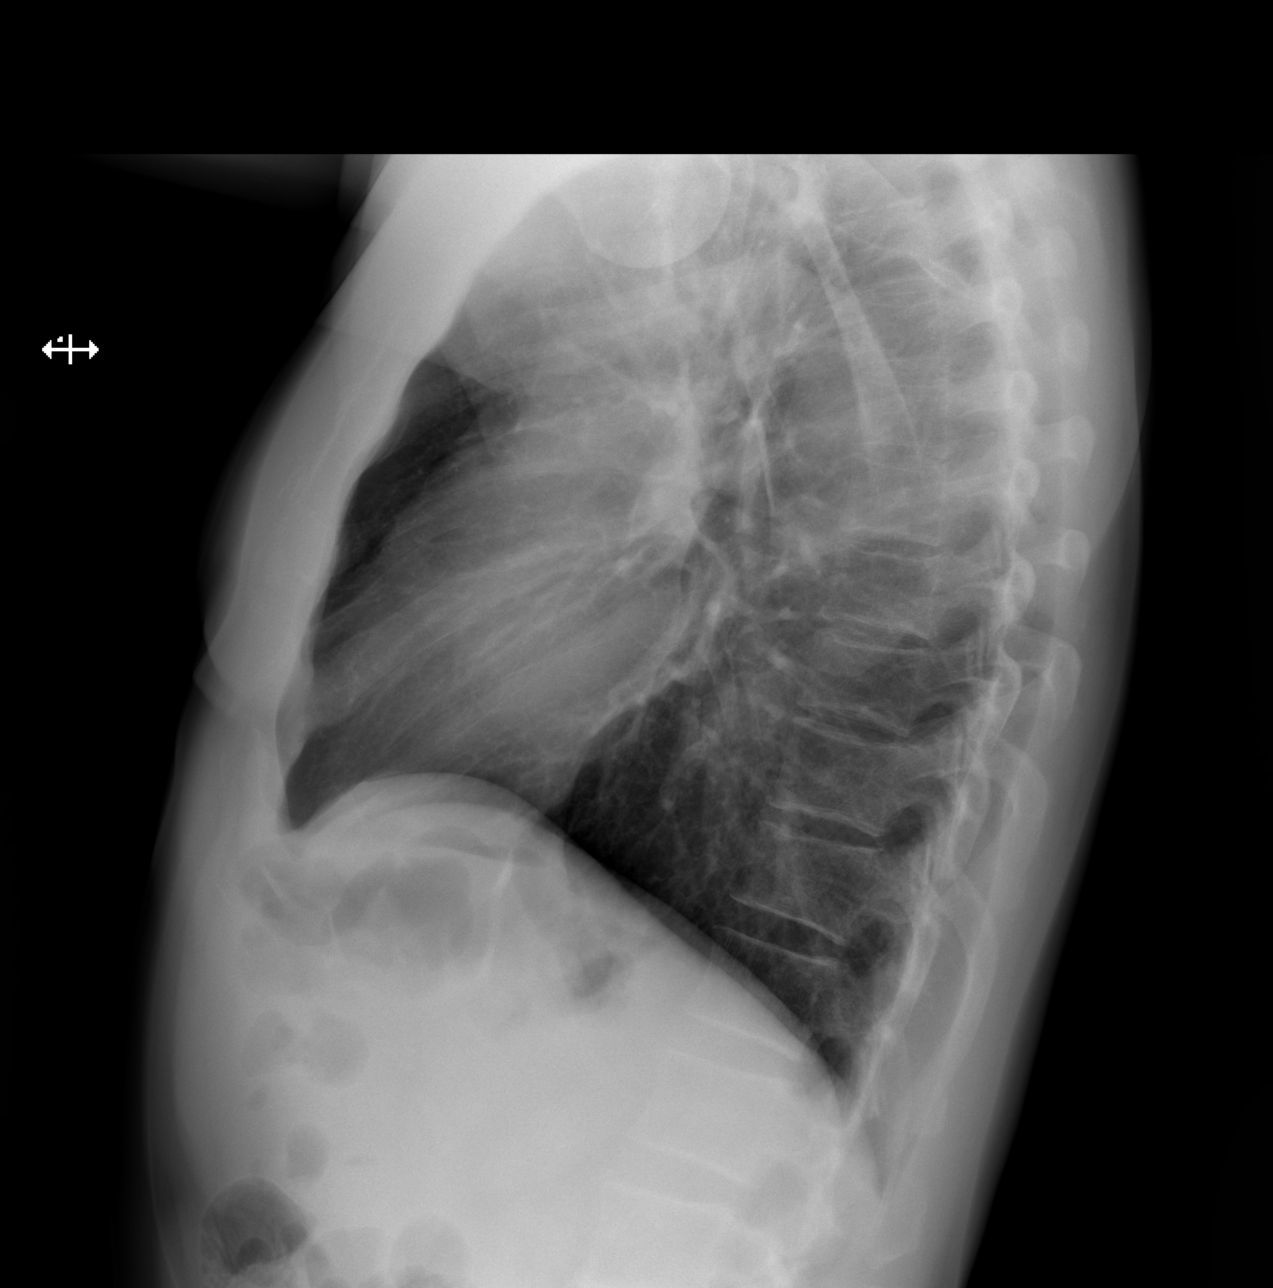

[3 of 3 positions shown; findings below may reference images not displayed]

FINDINGS: The heart size and mediastinal contours are within normal limits.

No focal consolidation. No pulmonary edema. No pleural effusion. No
pneumothorax.

No acute osseous abnormality.  Anterior cervical fusion noted.
IMPRESSION: No active cardiopulmonary disease. Given history of weight loss,
please note that a CT chest is a much more sensitive evaluation for
pulmonary nodule or masses compared to a chest x-ray.

## 2022-01-12 ENCOUNTER — Other Ambulatory Visit (HOSPITAL_COMMUNITY): Payer: Self-pay

## 2022-01-12 ENCOUNTER — Encounter (HOSPITAL_COMMUNITY): Payer: Self-pay

## 2022-01-12 ENCOUNTER — Other Ambulatory Visit: Payer: Self-pay

## 2022-01-12 ENCOUNTER — Emergency Department (HOSPITAL_COMMUNITY)
Admission: EM | Admit: 2022-01-12 | Discharge: 2022-01-12 | Disposition: A | Discharge status: Home / Self Care | Payer: No Typology Code available for payment source | Attending: Emergency Medicine | Admitting: Emergency Medicine

## 2022-01-12 DIAGNOSIS — K047 Periapical abscess without sinus: Secondary | ICD-10-CM | POA: Insufficient documentation

## 2022-01-12 DIAGNOSIS — R220 Localized swelling, mass and lump, head: Secondary | ICD-10-CM | POA: Diagnosis not present

## 2022-01-12 DIAGNOSIS — K029 Dental caries, unspecified: Secondary | ICD-10-CM | POA: Insufficient documentation

## 2022-01-12 DIAGNOSIS — I10 Essential (primary) hypertension: Secondary | ICD-10-CM | POA: Diagnosis not present

## 2022-01-12 DIAGNOSIS — K0889 Other specified disorders of teeth and supporting structures: Secondary | ICD-10-CM | POA: Diagnosis present

## 2022-01-12 MED ORDER — NAPROXEN 375 MG PO TABS: 375.0000 mg | ORAL_TABLET | Freq: Two times a day (BID) | ORAL | 0 refills | Status: DC

## 2022-01-12 MED ORDER — NAPROXEN 375 MG PO TABS
375.0000 mg | ORAL_TABLET | Freq: Once | ORAL | Status: AC
  Administered 2022-01-12: 375 mg via ORAL
  Filled 2022-01-12: qty 1

## 2022-01-12 MED ORDER — PENICILLIN V POTASSIUM 500 MG PO TABS
500.0000 mg | ORAL_TABLET | Freq: Once | ORAL | Status: AC
  Administered 2022-01-12: 500 mg via ORAL
  Filled 2022-01-12: qty 1

## 2022-01-12 MED ORDER — NAPROXEN 375 MG PO TABS
375.0000 mg | ORAL_TABLET | Freq: Two times a day (BID) | ORAL | 0 refills | Status: DC
  Filled 2022-01-12 (×2): qty 20, 10d supply, fill #0

## 2022-01-12 MED ORDER — PENICILLIN V POTASSIUM 500 MG PO TABS: 500.0000 mg | ORAL_TABLET | Freq: Four times a day (QID) | ORAL | 0 refills | Status: DC

## 2022-01-12 MED ORDER — PENICILLIN V POTASSIUM 500 MG PO TABS
500.0000 mg | ORAL_TABLET | Freq: Four times a day (QID) | ORAL | 0 refills | Status: AC
  Filled 2022-01-12: qty 28, 7d supply, fill #0

## 2022-01-12 NOTE — ED Provider Notes (Signed)
[facility]-EMERGENCY DEPT Provider Note   CSN: [id number] Arrival date & time: 01/12/22  1000     History  Chief Complaint  Patient presents with   Dental Pain    Travis Simpson is a 56 y.o. male.   Dental Pain    Patient presents to the ED for evaluation of dental pain.  Patient states he started having some pain and discomfort a couple days ago.  He called his dentist but cannot see them until Wednesday.  Today patient noticed some increased swelling in his left cheek area.  He has pain with biting down and chewing.  He is not having any difficulty swallowing.  No difficulty breathing.  No fevers  Home Medications Prior to Admission medications   Medication Sig Start Date End Date Taking? Authorizing Provider  naproxen (NAPROSYN) 375 MG tablet Take 1 tablet (375 mg total) by mouth 2 (two) times daily. 01/12/22  Yes Linwood Dibbles, MD  penicillin v potassium (VEETID) 500 MG tablet Take 1 tablet (500 mg total) by mouth 4 (four) times daily for 7 days. 01/12/22 01/19/22 Yes Linwood Dibbles, MD  pantoprazole (PROTONIX) 40 MG tablet TAKE 1 TABLET BY MOUTH ONCE A DAY 03/23/20 03/23/21  Merri Brunette, MD  pantoprazole (PROTONIX) 40 MG tablet Take 1 tablet my mouth once daily. 08/15/20     VIAGRA 100 MG tablet TK 1 T PO PRN 30 MIN PRIOR TO NEED 06/14/16   [provider]      Allergies    Patient has no known allergies.    Review of Systems   Review of Systems  Physical Exam Updated Vital Signs BP (!) 162/104 (BP Location: Left Arm)   Pulse 78   Temp 97.9 F (36.6 C) (Oral)   Resp 18   Ht 1.892 m (6' 2.5")   Wt 86.2 kg   SpO2 99%   BMI 24.07 kg/m  Physical Exam Vitals and nursing note reviewed.  Constitutional:      General: He is not in acute distress.    Appearance: He is well-developed.  HENT:     Head: Normocephalic and atraumatic.     Comments: Mild erythema left cheek, no significant edema noted    Right Ear: External ear normal.     Left Ear:  External ear normal.     Mouth/Throat:     Dentition: Gingival swelling and dental caries present.     Comments: Small amount of swelling left upper premolar area, ttp, no lymphadenopathy, submandibular swelling Eyes:     General: No scleral icterus.       Right eye: No discharge.        Left eye: No discharge.     Conjunctiva/sclera: Conjunctivae normal.  Neck:     Trachea: No tracheal deviation.  Cardiovascular:     Rate and Rhythm: Normal rate.  Pulmonary:     Effort: Pulmonary effort is normal. No respiratory distress.     Breath sounds: No stridor.  Abdominal:     General: There is no distension.  Musculoskeletal:        General: No swelling or deformity.     Cervical back: Neck supple.  Skin:    General: Skin is warm and dry.     Findings: No rash.  Neurological:     Mental Status: He is alert.     Cranial Nerves: Cranial nerve deficit: no gross deficits.     ED Results / Procedures / Treatments   Labs (all labs  ordered are listed, but only abnormal results are displayed) Labs Reviewed - No data to display  EKG None  Radiology No results found.  Procedures Procedures    Medications Ordered in ED Medications  penicillin v potassium (VEETID) tablet 500 mg (has no administration in time range)  naproxen (NAPROSYN) tablet 375 mg (has no administration in time range)    ED Course/ Medical Decision Making/ A&P                           Medical Decision Making Problems Addressed: Dental infection: acute illness or injury that poses a threat to life or bodily functions Hypertension, unspecified type: undiagnosed new problem with uncertain prognosis  Risk Prescription drug management.   Patient presented to the ER for evaluation of a tooth ache.  Patient does have evidence of dental infection on exam.  Likely dental caries.  No signs of severe abscess.  Will start the patient on a course of antibiotics.  NSAIDs as needed for pain and discomfort.  Recommend  close outpatient follow-up with a dentist.  Patient also noted to be hypertensive.  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  He does see a primary care doctor adnt this has been noted in the past.  It always returns to normal.   recommend outpatient follow-up with PCP        Final Clinical Impression(s) / ED Diagnoses Final diagnoses:  Dental infection  Hypertension, unspecified type    Rx / DC Orders ED Discharge Orders          Ordered    penicillin v potassium (VEETID) 500 MG tablet  4 times daily        01/12/22 1026    naproxen (NAPROSYN) 375 MG tablet  2 times daily        01/12/22 1026              Linwood Dibbles, MD 01/12/22 1031

## 2022-01-12 NOTE — Discharge Instructions (Signed)
Take the antibiotics as prescribed to treat your dental infection.  Follow-up with a dentist as we discussed for further evaluation.  Return as needed for fevers difficulty swallowing, worsening symptoms

## 2022-01-12 NOTE — ED Triage Notes (Signed)
Patient c/o left upper dental pain and swelling  x 2 days.

## 2022-01-29 ENCOUNTER — Other Ambulatory Visit (HOSPITAL_COMMUNITY): Payer: Self-pay

## 2022-01-29 MED ORDER — AMOXICILLIN 875 MG PO TABS
875.0000 mg | ORAL_TABLET | Freq: Two times a day (BID) | ORAL | 0 refills | Status: DC
Start: 2022-01-29 — End: 2023-02-19
  Filled 2022-01-29: qty 14, 7d supply, fill #0

## 2022-09-13 DIAGNOSIS — Z125 Encounter for screening for malignant neoplasm of prostate: Secondary | ICD-10-CM | POA: Diagnosis not present

## 2022-09-13 DIAGNOSIS — Z0000 Encounter for general adult medical examination without abnormal findings: Secondary | ICD-10-CM | POA: Diagnosis not present

## 2022-09-13 DIAGNOSIS — E7800 Pure hypercholesterolemia, unspecified: Secondary | ICD-10-CM | POA: Diagnosis not present

## 2022-09-13 DIAGNOSIS — K219 Gastro-esophageal reflux disease without esophagitis: Secondary | ICD-10-CM | POA: Diagnosis not present

## 2022-10-15 DIAGNOSIS — R972 Elevated prostate specific antigen [PSA]: Secondary | ICD-10-CM | POA: Diagnosis not present

## 2022-11-22 ENCOUNTER — Other Ambulatory Visit (HOSPITAL_COMMUNITY): Payer: Self-pay

## 2022-11-22 DIAGNOSIS — R972 Elevated prostate specific antigen [PSA]: Secondary | ICD-10-CM | POA: Diagnosis not present

## 2022-11-22 DIAGNOSIS — N400 Enlarged prostate without lower urinary tract symptoms: Secondary | ICD-10-CM | POA: Diagnosis not present

## 2022-11-22 MED ORDER — SILDENAFIL CITRATE 100 MG PO TABS
100.0000 mg | ORAL_TABLET | ORAL | 3 refills | Status: DC
  Filled 2022-11-22: qty 6, 30d supply, fill #0
  Filled 2023-04-26: qty 6, 30d supply, fill #1
  Filled 2023-06-12: qty 6, 30d supply, fill #2

## 2022-11-22 MED ORDER — LEVOFLOXACIN 750 MG PO TABS
750.0000 mg | ORAL_TABLET | ORAL | 0 refills | Status: DC
  Filled 2022-11-22: qty 1, 1d supply, fill #0

## 2022-11-28 ENCOUNTER — Other Ambulatory Visit: Payer: Self-pay | Admitting: Urology

## 2022-11-28 DIAGNOSIS — R972 Elevated prostate specific antigen [PSA]: Secondary | ICD-10-CM

## 2023-01-04 ENCOUNTER — Encounter: Payer: Self-pay | Admitting: Urology

## 2023-01-10 ENCOUNTER — Ambulatory Visit
Admission: RE | Admit: 2023-01-10 | Discharge: 2023-01-10 | Disposition: A | Discharge status: Home / Self Care | Payer: 59 | Source: Ambulatory Visit | Attending: Urology | Admitting: Urology

## 2023-01-10 DIAGNOSIS — R972 Elevated prostate specific antigen [PSA]: Secondary | ICD-10-CM

## 2023-01-10 MED ORDER — GADOPICLENOL 0.5 MMOL/ML IV SOLN
9.0000 mL | Freq: Once | INTRAVENOUS | Status: AC | PRN
  Administered 2023-01-10: 9 mL via INTRAVENOUS

## 2023-01-25 DIAGNOSIS — R972 Elevated prostate specific antigen [PSA]: Secondary | ICD-10-CM | POA: Diagnosis not present

## 2023-02-04 DIAGNOSIS — H524 Presbyopia: Secondary | ICD-10-CM | POA: Diagnosis not present

## 2023-02-04 DIAGNOSIS — H5213 Myopia, bilateral: Secondary | ICD-10-CM | POA: Diagnosis not present

## 2023-02-04 DIAGNOSIS — H52223 Regular astigmatism, bilateral: Secondary | ICD-10-CM | POA: Diagnosis not present

## 2023-02-05 DIAGNOSIS — C61 Malignant neoplasm of prostate: Secondary | ICD-10-CM | POA: Diagnosis not present

## 2023-02-07 ENCOUNTER — Telehealth: Payer: Self-pay

## 2023-02-07 NOTE — Telephone Encounter (Signed)
I called pt to introduce myself as the Coordinator of the [facility].   1. I confirmed with the patient he is aware of his referral to the clinic 10/8, arriving @ 12:30 pm.    2. I discussed the format of the clinic and the physicians he will be seeing that day.   3. I discussed where the clinic is located and how to contact me.   4. I confirmed his address and informed him I would be mailing a packet of information and forms to be completed. I asked him to bring them with him the day of his appointment.    He voiced understanding of the above. I asked him to call me if he has any questions or concerns regarding his appointments or the forms he needs to complete.

## 2023-02-14 NOTE — Progress Notes (Signed)
RN spoke with patient to confirm upcoming appointment to [facility] on 10/8.  All questions answered.

## 2023-02-18 DIAGNOSIS — C61 Malignant neoplasm of prostate: Secondary | ICD-10-CM | POA: Insufficient documentation

## 2023-02-19 ENCOUNTER — Ambulatory Visit
Admission: RE | Admit: 2023-02-19 | Discharge: 2023-02-19 | Disposition: A | Discharge status: Home / Self Care | Payer: 59 | Source: Ambulatory Visit | Attending: Radiation Oncology | Admitting: Radiation Oncology

## 2023-02-19 ENCOUNTER — Inpatient Hospital Stay: Payer: 59

## 2023-02-19 ENCOUNTER — Encounter: Payer: Self-pay | Admitting: Radiation Oncology

## 2023-02-19 VITALS — BP 147/100 | HR 79 | Temp 97.3°F | Resp 18 | Ht 74.5 in | Wt 194.5 lb

## 2023-02-19 DIAGNOSIS — Z803 Family history of malignant neoplasm of breast: Secondary | ICD-10-CM | POA: Insufficient documentation

## 2023-02-19 DIAGNOSIS — C61 Malignant neoplasm of prostate: Secondary | ICD-10-CM | POA: Diagnosis not present

## 2023-02-19 DIAGNOSIS — F17290 Nicotine dependence, other tobacco product, uncomplicated: Secondary | ICD-10-CM | POA: Insufficient documentation

## 2023-02-19 DIAGNOSIS — Z191 Hormone sensitive malignancy status: Secondary | ICD-10-CM | POA: Diagnosis not present

## 2023-02-19 NOTE — Progress Notes (Signed)
[facility] CONSULT NOTE  Patient Care Team: Merri Brunette, MD as PCP - General (Family Medicine) Artis Delay, MD as Consulting Physician (Hematology and Oncology) Cherlyn Cushing, RN as Oncology Nurse Navigator  ASSESSMENT & PLAN:  Travis Simpson is a 57 y.o.male being seen at [facility] for prostate cancer.  Current diagnosis: Stage IIB (cT1c, cN0, cM0, PSA: 5.9, Grade Group: 2  Initial diagnosis: Stage IIB (cT1c, cN0, cM0, PSA: 5.9, Grade Group: 2  Germline testing: NA Somatic testing: NA Treatment: to be determined  Travis Simpson is here to discuss his options. He is meeting with Urologic Oncology and Radiation Oncology as well. I explained his pathology with him and his wife. At this time, he has no medical need from medical oncology. Therefore, he may follow up with medical oncology as needed.  All questions were answered. The patient knows to call the clinic with any problems, questions or concerns. No barriers to learning was detected.  Melven Sartorius, MD 10/8/20243:12 PM  CHIEF COMPLAINTS/PURPOSE OF CONSULTATION:  prostate cancer  HISTORY OF PRESENTING ILLNESS:  Travis Simpson 57 y.o. male is here because of prostate cancer. I have reviewed his chart and materials related to his cancer extensively and collaborated history with the patient. Report of dribbling urine but no other symptoms. Summary of oncologic history is as follows:  He denies family history of prostate cancer.   Oncology History  Prostatic adenocarcinoma (HCC)  08/15/2020 Tumor Marker   PSA 3.5   09/06/2021 Tumor Marker   PSA 3.98   09/13/2022 Tumor Marker   PSA 5.23   10/15/2022 Tumor Marker   PSA 5.91   01/10/2023 Imaging   MR: PI-RADS category 4 lesion of the left anterior peripheral zone in the mid gland and base   01/25/2023 Cancer Staging   Staging form: Prostate, AJCC 8th Edition - Clinical stage from 01/25/2023: Stage IIB (cT1c, cN0, cM0, PSA: 5.9, Grade Group: 2) - Signed by [name] Fennel, PA-C on 02/19/2023 Histopathologic type: Adenocarcinoma, NOS Stage prefix: Initial diagnosis Prostate specific antigen (PSA) range: Less than 10 Gleason primary pattern: 3 Gleason secondary pattern: 4 Gleason score: 7 Histologic grading system: 5 grade system Number of biopsy cores examined: 16 Number of biopsy cores positive: 7 Location of positive needle core biopsies: One side   01/28/2023 Pathology Results   Biopsy showed prostate adenocarcinoma left ROI 1 Gleason 3+4 equal to 7 involves 4 cores 60%, 3%, 50%, 50% pattern 4 equals 20%. Highest Gleason score 3+4=7 GG2.   02/18/2023 Initial Diagnosis   Prostatic adenocarcinoma [facility])     MEDICAL HISTORY:  Past Medical History:  Diagnosis Date   Chronic back pain    herniated disc   Weakness    tingling in right leg    SURGICAL HISTORY: Past Surgical History:  Procedure Laterality Date   ANTERIOR CERVICAL DECOMP/DISCECTOMY FUSION  07/12/2008   COLONOSCOPY  2016   KNEE ARTHROSCOPY Left 05/15/1979   POSTERIOR LUMBAR FUSION  07/29/2013    SOCIAL HISTORY: Social History   Socioeconomic History   Marital status: Married    Spouse name: Not on file   Number of children: Not on file   Years of education: Not on file   Highest education level: Not on file  Occupational History   Not on file  Tobacco Use   Smoking status: Some Days    Types: Cigars   Smokeless tobacco: Never  Vaping Use   Vaping status: Never Used  Substance and Sexual Activity  Alcohol use: Yes    Comment: 12 pack beer weekly   Drug use: No   Sexual activity: Yes  Other Topics Concern   Not on file  Social History Narrative   Not on file   Social Determinants of Health   Financial Resource Strain: Not on file  Food Insecurity: Not on file  Transportation Needs: Not on file  Physical Activity: Not on file  Stress: Not on file  Social Connections: Not on file  Intimate Partner Violence: Not on file    FAMILY HISTORY: Family  History  Problem Relation Age of Onset   Heart attack Mother    Breast cancer Mother     ALLERGIES:  has No Known Allergies.  MEDICATIONS:  Current Outpatient Medications  Medication Sig Dispense Refill   naproxen (NAPROSYN) 375 MG tablet Take 1 tablet (375 mg total) by mouth 2 (two) times daily. 20 tablet 0   pantoprazole (PROTONIX) 40 MG tablet Take 1 tablet my mouth once daily. 30 tablet 3   sildenafil (VIAGRA) 100 MG tablet Take 1 tablet (100 mg total) by mouth 1 hour prior to sexual activities as needed. 30 tablet 3   No current facility-administered medications for this visit.    REVIEW OF SYSTEMS:   All other systems were reviewed with the patient and are negative.  PHYSICAL EXAMINATION: ECOG PERFORMANCE STATUS: 0 - Asymptomatic  There were no vitals filed for this visit. There were no vitals filed for this visit.  GENERAL: alert, no distress and comfortable   LABORATORY DATA:  I have reviewed the data as listed Lab Results  Component Value Date   WBC 9.3 08/14/2016   HGB 14.9 08/14/2016   HCT 45.2 08/14/2016   MCV 90.2 08/14/2016   PLT 224 08/14/2016   No results for input(s): "NA", "K", "CL", "CO2", "GLUCOSE", "BUN", "CREATININE", "CALCIUM", "GFRNONAA", "GFRAA", "PROT", "ALBUMIN", "AST", "ALT", "ALKPHOS", "BILITOT", "BILIDIR", "IBILI" in the last 8760 hours.  RADIOGRAPHIC STUDIES: I have personally reviewed the radiological images as listed and agreed with the findings in the report. No results found.

## 2023-02-19 NOTE — Progress Notes (Signed)
Care Plan Summary  Name: Travis Simpson  DOB: [birth year]   Your Medical Team:   Urologist -  Dr. Heloise Purpura, [facility]  Radiation Oncologist - Dr. Margaretmary Dys, [facility]   Medical Oncologist - Dr. Geanie Berlin, [facility]  Recommendations: 1) Radiation   2) Surgery    * These recommendations are based on information available as of today's consult.      Recommendations may change depending on the results of further tests or exams.    Next Steps: 1) Consider all your options.  Contact [name], your prostate navigator, with any questions or treatment decisions.    When appointments need to be scheduled, you will be contacted by [facility] and/or [facility].  Questions?  Please do not hesitate to call Cherlyn Cushing, BSN, RN at [phone] with any questions or concerns.  [name] is your Oncology Nurse Navigator and is available to assist you while you're receiving your medical care at [facility].

## 2023-02-19 NOTE — Consult Note (Signed)
Multi-Disciplinary Clinic     02/19/2023   --------------------------------------------------------------------------------   Frances Nickels  MRN: [id number]  DOB: 09-05-1965, 57 year old Male  SSN:    PRIMARY CARE:  Dario Guardian, MD  PRIMARY CARE FAX:  [phone]  REFERRING:  Dario Guardian, MD  PROVIDER:  Rutherford Nail, M.D.  TREATING:  Heloise Purpura, M.D.  LOCATION:  [facility] [phone]     --------------------------------------------------------------------------------   CC/HPI: CC: Prostate Cancer   Physician requesting consult: Dr. Verdia Kuba  PCP: Dr. Merri Brunette  Location of consult: [facility] - Prostate Cancer Multidisciplinary Clinic   Mr. Travis Simpson is a 57 year old healthy gentleman who was found to have an elevated PSA of 5.91. This prompted an MRI of the prostate on 01/10/23 that indicated a 1.3 cm PI-RADS 4 lesion of the left anterior mid gland. An MR/US fusion biopsy was performed on 01/25/23 and confirmed Gleason 3+4=7 adenocarcinoma with all 4 targeted biopsies positive and 3 out of 12 systematic biopsies positive for malignancy.   Family history: None.   Imaging studies: MRI (01/10/23) - No EPE, SVI, LAD, or bone lesions.   PMH: He has no major medical comorbid conditions.  PSH: No abdominal surgeries.   TNM stage: cT1c N0 Mx  PSA: 5.91  Gleason score: 3+4=7 (GG 2)  Biopsy (01/25/23): 7/16 cores positive  Left: L lateral apex (20%, 3+3=6), L apex (40%, 3+4=7), L lateral mid (30%, 3+4=7)  Right: Benign  ROI: 4/4 cores (60%, 60%, 50%, 50%, 3+4=7)  Prostate volume: 31.2 cc   Nomogram  OC disease: 62%  EPE: 36%  SVI: 3%  LNI: 4%  PFS (5 year, 10 year): 81%, 68%   Urinary function: IPSS is 9.  Erectile function: SHIM score is 9.     ALLERGIES: No Known Drug Allergies    MEDICATIONS: Sildenafil Citrate 100 mg tablet 1 tablet PO prn Take 1 tab po 1hr prior to sexual activities     GU PSH:  Prostate Needle Biopsy - 01/25/2023     NON-GU PSH: Back surgery Neck Surgery Surgical Pathology, Gross And Microscopic Examination For Prostate Needle - 01/25/2023 Visit Complexity (formerly GPC1X) - 02/05/2023, 11/22/2022     GU PMH: Prostate Cancer - 02/05/2023 Elevated PSA - 01/25/2023, - 11/22/2022 BPH w/o LUTS - 11/22/2022 ED due to arterial insufficiency (Stable) - 11/22/2022, - 2021 Encounter for Prostate Cancer screening (Stable) - 11/22/2022, - 2021    NON-GU PMH: No Non-GU PMH    FAMILY HISTORY: 1 son - Other lupus - Sister   SOCIAL HISTORY: Marital Status: Married Preferred Language: English; Race: Black or African American Current Smoking Status: Patient smokes occasionally.   Tobacco Use Assessment Completed: Used Tobacco in last 30 days? Does drink.  Drinks 2 caffeinated drinks per day. Patient's occupation is/was [employer].     Notes: smokes cigars   REVIEW OF SYSTEMS:    GU Review Male:   Patient denies frequent urination, hard to postpone urination, burning/ pain with urination, get up at night to urinate, leakage of urine, stream starts and stops, trouble starting your streams, and have to strain to urinate .  Gastrointestinal (Upper):   Patient denies nausea and vomiting.  Gastrointestinal (Lower):   Patient denies diarrhea and constipation.  Constitutional:   Patient denies fever, night sweats, weight loss, and fatigue.  Skin:   Patient denies skin rash/ lesion and itching.  Eyes:   Patient denies blurred vision and double vision.  Ears/ Nose/ Throat:   Patient denies sore throat and sinus problems.  Hematologic/Lymphatic:   Patient denies swollen glands and easy bruising.  Cardiovascular:   Patient denies leg swelling and chest pains.  Respiratory:   Patient denies cough and shortness of breath.  Endocrine:   Patient denies excessive thirst.  Musculoskeletal:   Patient denies back pain and joint pain.  Neurological:   Patient denies headaches and dizziness.   Psychologic:   Patient denies anxiety and depression.   VITAL SIGNS: None   GU PHYSICAL EXAMINATION:    Prostate: Prostate about 40 grams. Left lobe normal consistency, right lobe normal consistency. Symmetrical lobes. No prostate nodule. Left lobe no tenderness, right lobe no tenderness.    MULTI-SYSTEM PHYSICAL EXAMINATION:    Constitutional: Well-nourished. No physical deformities. Normally developed. Good grooming.  Respiratory: No labored breathing, no use of accessory muscles. Clear bilaterally.  Cardiovascular: Normal temperature, normal extremity pulses, no swelling, no varicosities. Regular rate and rhythm.  Gastrointestinal: No mass, no tenderness, no rigidity, non obese abdomen.      Complexity of Data:  Lab Test Review:   PSA  Records Review:   Pathology Reports, Previous Patient Records  X-Ray Review: MRI Prostate GSORAD: Reviewed Films.     PROCEDURES: None   ASSESSMENT:      ICD-10 Details  1 GU:   Prostate Cancer - C61    PLAN:           Document Letter(s):  Created for Patient: Clinical Summary         Notes:   1. Favorable intermediate risk prostate cancer: I had a detailed discussion with Kevork and his wife today regarding his prostate cancer situation. The patient was counseled about the natural history of prostate cancer and the standard treatment options that are available for prostate cancer. It was explained to him how his age and life expectancy, clinical stage, Gleason score/prognostic grade group, and PSA (and PSA density) affect his prognosis, the decision to proceed with additional staging studies, as well as how that information influences recommended treatment strategies. We discussed the roles for active surveillance, radiation therapy, surgical therapy, androgen deprivation, as well as ablative therapy and other investigational options for the treatment of prostate cancer as appropriate to his individual cancer situation. We discussed the risks and  benefits of these options with regard to their impact on cancer control and also in terms of potential adverse events, complications, and impact on quality of life particularly related to urinary and sexual function. The patient was encouraged to ask questions throughout the discussion today and all questions were answered to his stated satisfaction. In addition, the patient was provided with and/or directed to appropriate resources and literature for further education about prostate cancer and treatment options. We discussed surgical therapy for prostate cancer including the different available surgical approaches. We discussed, in detail, the risks and expectations of surgery with regard to cancer control, urinary control, and erectile function as well as the expected postoperative recovery process. Additional risks of surgery including but not limited to bleeding, infection, hernia formation, nerve damage, lymphocele formation, bowel/rectal injury potentially necessitating colostomy, damage to the urinary tract resulting in urine leakage, urethral stricture, and the cardiopulmonary risks such as myocardial infarction, stroke, death, venothromboembolism, etc. were explained. The risk of open surgical conversion for robotic/laparoscopic prostatectomy was also discussed.   He is scheduled to meet with both Dr. Kathrynn Running and Dr. Cherly Hensen later this afternoon. If he does proceed with surgical therapy, my tentative  plan would be to perform a bilateral nerve sparing robot-assisted laparoscopic radical prostatectomy and bilateral pelvic lymphadenectomy.   CC: Dr. Merri Brunette  Dr. Verdia Kuba  Dr. Margaretmary Dys  Dr. Geanie Berlin    E & M CODES: We spent 52 minutes dedicated to evaluation and management time, including face to face interaction, discussions on coordination of care, documentation, result review, and discussion with others as applicable.

## 2023-02-19 NOTE — Progress Notes (Signed)
Radiation Oncology         [phone] ________________________________  Multidisciplinary Prostate Cancer Clinic  Initial Radiation Oncology Consultation  Name: Travis Simpson MRN: [id number]  Date: 02/19/2023  DOB: 1965/10/11  NW:GNFAO, Travis Masters, MD  Travis Elliot, MD   REFERRING PHYSICIAN: Crista Elliot, MD  DIAGNOSIS: 57 y.o. gentleman with stage T1c adenocarcinoma of the prostate with a Gleason's score of 3+4 and a PSA of 5.91    ICD-10-CM   1. Prostatic adenocarcinoma (HCC)  C61       HISTORY OF PRESENT ILLNESS::Travis Simpson is a 57 y.o. gentleman.  He has a history of erectile dysfunction and was previously seen with Dr. Alvester Simpson in 2019 for management.  More recently, he was noted to have an elevated PSA of 5.91 by his primary care physician, Dr. Merri Simpson.  Accordingly, he was referred back to the urology office for evaluation with Dr. Alvester Simpson on 11/22/22,  digital rectal examination performed at that time showed no nodules or induration. A prostate MRI was performed on 01/10/23 showing a PI-RADS 4 lesion in the left anterior peripheral zone involving the mid gland and base. The patient proceeded to MRI fusion biopsy of the prostate on 01/25/23.  The prostate volume measured 32.23 cc.  Out of 16 core biopsies, 7 were positive, all on the left.  The maximum Gleason score was 3+4, and this was seen in the left mid lateral, left apex, and all 4 samples from ROI. Additionally, Gleason 3+3 was seen in the left apex lateral.  The patient reviewed the biopsy results with his urologist and he has kindly been referred today to the multidisciplinary prostate cancer clinic for presentation of pathology and radiology studies in our conference for discussion of potential radiation treatment options and clinical evaluation.  He is accompanied by his wife, Travis Simpson, for today's visit  PREVIOUS RADIATION THERAPY: No  PAST MEDICAL HISTORY:  has a past medical history of Chronic back  pain and Weakness.    PAST SURGICAL HISTORY: Past Surgical History:  Procedure Laterality Date   ANTERIOR CERVICAL DECOMP/DISCECTOMY FUSION  07/12/2008   COLONOSCOPY  2016   KNEE ARTHROSCOPY Left 05/15/1979   POSTERIOR LUMBAR FUSION  07/29/2013    FAMILY HISTORY: family history includes Breast cancer (age of onset: 28) in his mother; Heart attack in his mother.  SOCIAL HISTORY:  reports that he has been smoking cigars. He has never used smokeless tobacco. He reports current alcohol use. He reports that he does not use drugs.  ALLERGIES: Patient has no known allergies.  MEDICATIONS:  Current Outpatient Medications  Medication Sig Dispense Refill   naproxen (NAPROSYN) 375 MG tablet Take 1 tablet (375 mg total) by mouth 2 (two) times daily. 20 tablet 0   pantoprazole (PROTONIX) 40 MG tablet Take 1 tablet my mouth once daily. 30 tablet 3   sildenafil (VIAGRA) 100 MG tablet Take 1 tablet (100 mg total) by mouth 1 hour prior to sexual activities as needed. 30 tablet 3   No current facility-administered medications for this encounter.    REVIEW OF SYSTEMS:  On review of systems, the patient reports that he is doing well overall. He denies any chest pain, shortness of breath, cough, fevers, chills, night sweats, unintended weight changes. He denies any bowel disturbances, and denies abdominal pain, nausea or vomiting. He denies any new musculoskeletal or joint aches or pains. His IPSS was 9, indicating moderate urinary symptoms. His SHIM was 9, indicating he has moderate-severe  erectile dysfunction. A complete review of systems is obtained and is otherwise negative.   PHYSICAL EXAM:  Wt Readings from Last 3 Encounters:  02/19/23 194 lb 8 oz (88.2 kg)  01/12/22 190 lb (86.2 kg)  08/14/16 184 lb 3.2 oz (83.6 kg)   Temp Readings from Last 3 Encounters:  02/19/23 (!) 97.3 F (36.3 C) (Temporal)  01/12/22 97.9 F (36.6 C) (Oral)  08/14/16 97.6 F (36.4 C) (Oral)   BP Readings from Last  3 Encounters:  02/19/23 (!) 147/100  01/12/22 (!) 162/104  08/14/16 124/82   Pulse Readings from Last 3 Encounters:  02/19/23 79  01/12/22 78  08/14/16 80    /10  In general this is a well appearing African-American man in no acute distress. He's alert and oriented x4 and appropriate throughout the examination. Cardiopulmonary assessment is negative for acute distress and he exhibits normal effort.    KPS = 100  100 - Normal; no complaints; no evidence of disease. 90   - Able to carry on normal activity; minor signs or symptoms of disease. 80   - Normal activity with effort; some signs or symptoms of disease. 17   - Cares for self; unable to carry on normal activity or to do active work. 60   - Requires occasional assistance, but is able to care for most of his personal needs. 50   - Requires considerable assistance and frequent medical care. 40   - Disabled; requires special care and assistance. 30   - Severely disabled; hospital admission is indicated although death not imminent. 20   - Very sick; hospital admission necessary; active supportive treatment necessary. 10   - Moribund; fatal processes progressing rapidly. 0     - Dead  Karnofsky DA, Abelmann WH, Craver LS and Burchenal [facility] [phone]) The use of the nitrogen mustards in the palliative treatment of carcinoma: with particular reference to bronchogenic carcinoma Cancer 1 [id number]   LABORATORY DATA:  Lab Results  Component Value Date   WBC 9.3 08/14/2016   HGB 14.9 08/14/2016   HCT 45.2 08/14/2016   MCV 90.2 08/14/2016   PLT 224 08/14/2016   Lab Results  Component Value Date   NA 139 07/23/2008   K 3.7 07/23/2008   CL 105 07/23/2008   CO2 27 07/23/2008   No results found for: "ALT", "AST", "GGT", "ALKPHOS", "BILITOT"   RADIOGRAPHY: No results found.    IMPRESSION/PLAN: 57 y.o. gentleman with Stage T1c adenocarcinoma of the prostate with a Gleason score of 3+4 and a PSA of 5.91.    We discussed the patient's  workup and outlined the nature of prostate cancer in this setting. The patient's T stage, Gleason's score, and PSA put him into the favorable intermediate risk group. Accordingly, he is eligible for a variety of potential treatment options including brachytherapy, 5.5 weeks of external radiation, or prostatectomy. We discussed the available radiation techniques, and focused on the details and logistics of delivery. We discussed and outlined the risks, benefits, short and long-term effects associated with radiotherapy and compared and contrasted these with prostatectomy. We discussed the role of SpaceOAR gel in reducing the rectal toxicity associated with radiotherapy. He appears to have a good understanding of his disease and our treatment recommendations which are of curative intent.  He and his wife were encouraged to ask questions that were answered to their stated satisfaction.  At the end of the conversation the patient remains undecided and plans to take some additional time to consider his  options before committing to any specific treatment.  He has our contact information and will let us know once he reaches a final decision so that we can proceed with treatment planning accordingly at that time.  We enjoyed meeting with him and his wife and look forward to continuing to follow along in his care.  They know that they are welcome to call at anytime with any questions or concerns and treatment options discussed today.   We personally spent 60 minutes in this encounter including chart review, reviewing radiological studies, meeting face-to-face with the patient, entering orders and completing documentation.    Marguarite Arbour, PA-C    Margaretmary Dys, MD  [facility]  Radiation Oncology Direct Dial: [phone]  Fax: [phone] [email or web address]  Skype  [email or web address]   This document serves as a record of services personally performed by Margaretmary Dys, MD and [name] Fennel, PA-C. It was  created on their behalf by Mickie Bail, a trained medical scribe. The creation of this record is based on the scribe's personal observations and the provider's statements to them. This document has been checked and approved by the attending provider.

## 2023-02-20 ENCOUNTER — Telehealth: Payer: Self-pay | Admitting: Genetic Counselor

## 2023-02-20 ENCOUNTER — Encounter: Payer: Self-pay | Admitting: Genetic Counselor

## 2023-02-20 NOTE — Telephone Encounter (Signed)
Mr. Vermeer was seen by a genetic counselor during the prostate cancer multidisciplinary clinic on 02/19/2023. In addition to his personal history of prostate cancer, he reported a family history of breast cancer in his mother diagnosed at age 58. He does not meet NCCN criteria for genetic testing at this time. He was still offered genetic counseling and testing but declined. We encourage him to contact us if there are any changes to his personal or family history of cancer. If he meets NCCN criteria based on the updated personal/family history, he would be recommended to have genetic counseling and testing.   Lalla Brothers, MS, [facility] Genetic Counselor [email or web address] (P) [phone]

## 2023-02-26 NOTE — Progress Notes (Signed)
RN left message for call back to follow up after recent PMDC.

## 2023-03-01 NOTE — Progress Notes (Signed)
Left message for call back.

## 2023-03-06 DIAGNOSIS — C61 Malignant neoplasm of prostate: Secondary | ICD-10-CM | POA: Diagnosis not present

## 2023-03-06 DIAGNOSIS — M6281 Muscle weakness (generalized): Secondary | ICD-10-CM | POA: Diagnosis not present

## 2023-03-07 NOTE — Progress Notes (Signed)
Patient has confirmed he would like to proceed with surgery for his stage T1c adenocarcinoma of the prostate with a Gleason score of 3+4 and a PSA of 5.91.    MD's notified, plan of care in progress.

## 2023-03-14 ENCOUNTER — Other Ambulatory Visit: Payer: Self-pay | Admitting: Urology

## 2023-03-15 NOTE — Progress Notes (Signed)
RN left message for call back to review upcoming surgery date and assess any additional questions.

## 2023-03-27 DIAGNOSIS — M62838 Other muscle spasm: Secondary | ICD-10-CM | POA: Diagnosis not present

## 2023-03-27 DIAGNOSIS — N393 Stress incontinence (female) (male): Secondary | ICD-10-CM | POA: Diagnosis not present

## 2023-03-27 DIAGNOSIS — M6281 Muscle weakness (generalized): Secondary | ICD-10-CM | POA: Diagnosis not present

## 2023-04-15 NOTE — Progress Notes (Signed)
RN spoke with patient to assess any questions or barriers prior to upcoming surgery on 12/19.  No additional needs at this time.  Will follow up after surgery.

## 2023-04-16 DIAGNOSIS — C61 Malignant neoplasm of prostate: Secondary | ICD-10-CM | POA: Diagnosis not present

## 2023-04-22 NOTE — Patient Instructions (Addendum)
DUE TO COVID-19 ONLY TWO VISITORS  (aged 58 and older)  ARE ALLOWED TO COME WITH YOU AND STAY IN THE WAITING ROOM ONLY DURING PRE OP AND PROCEDURE.   **NO VISITORS ARE ALLOWED IN THE SHORT STAY AREA OR RECOVERY ROOM!!**  IF YOU WILL BE ADMITTED INTO THE HOSPITAL YOU ARE ALLOWED ONLY FOUR SUPPORT PEOPLE DURING VISITATION HOURS ONLY (7 AM -8PM)   The support person(s) must pass our screening, gel in and out, and wear a mask at all times, including in the patient's room. Patients must also wear a mask when staff or their support person are in the room. Visitors GUEST BADGE MUST BE WORN VISIBLY  One adult visitor may remain with you overnight and MUST be in the room by 8 P.M.     Your procedure is scheduled on: 05/02/23   Report to [facility] Main Entrance    Report to admitting at : 9:00 AM   Call this number if you have problems the morning of surgery [phone]   Clear liquids diet starting the day before surgery until : 8:00 AM DAY OF SURGERY.  Water Black Coffee (sugar ok, NO MILK/CREAM OR CREAMERS)  Tea (sugar ok, NO MILK/CREAM OR CREAMERS) regular and decaf                             Plain Jell-O (NO RED)                                           Fruit ices (not with fruit pulp, NO RED)                                     Popsicles (NO RED)                                                                  Juice: apple, WHITE grape, WHITE cranberry Sports drinks like Gatorade (NO RED)              FOLLOW BOWEL PREP AND ANY ADDITIONAL PRE OP INSTRUCTIONS YOU RECEIVED FROM YOUR SURGEON'S OFFICE!!!   Oral Hygiene is also important to reduce your risk of infection.                                    Remember - BRUSH YOUR TEETH THE MORNING OF SURGERY WITH YOUR REGULAR TOOTHPASTE  DENTURES WILL BE REMOVED PRIOR TO SURGERY PLEASE DO NOT APPLY "Poly grip" OR ADHESIVES!!!   Do NOT smoke after Midnight   Take these medicines the morning of surgery with A SIP OF WATER:  NONE.                            You may not have any metal on your body including hair pins, jewelry, and body piercing             Do not wear lotions, powders,  perfumes/cologne, or deodorant              Men may shave face and neck.   Do not bring valuables to the hospital. [facility] IS NOT             RESPONSIBLE   FOR VALUABLES.   Contacts, glasses, or bridgework may not be worn into surgery.   Bring small overnight bag day of surgery.   DO NOT BRING YOUR HOME MEDICATIONS TO THE HOSPITAL. PHARMACY WILL DISPENSE MEDICATIONS LISTED ON YOUR MEDICATION LIST TO YOU DURING YOUR ADMISSION IN THE HOSPITAL!    Patients discharged on the day of surgery will not be allowed to drive home.  Someone NEEDS to stay with you for the first 24 hours after anesthesia.   Special Instructions: Bring a copy of your healthcare power of attorney and living will documents         the day of surgery if you haven't scanned them before.              Please read over the following fact sheets you were given: IF YOU HAVE QUESTIONS ABOUT YOUR PRE-OP INSTRUCTIONS PLEASE CALL [phone]  [facility] - Preparing for Surgery Before surgery, you can play an important role.  Because skin is not sterile, your skin needs to be as free of germs as possible.  You can reduce the number of germs on your skin by washing with CHG (chlorahexidine gluconate) soap before surgery.  CHG is an antiseptic cleaner which kills germs and bonds with the skin to continue killing germs even after washing. Please DO NOT use if you have an allergy to CHG or antibacterial soaps.  If your skin becomes reddened/irritated stop using the CHG and inform your nurse when you arrive at Short Stay. Do not shave (including legs and underarms) for at least 48 hours prior to the first CHG shower.  You may shave your face/neck. Please follow these instructions carefully:  1.  Shower with CHG Soap the night before surgery and the  morning of  Surgery.  2.  If you choose to wash your hair, wash your hair first as usual with your  normal  shampoo.  3.  After you shampoo, rinse your hair and body thoroughly to remove the  shampoo.                           4.  Use CHG as you would any other liquid soap.  You can apply chg directly  to the skin and wash                       Gently with a scrungie or clean washcloth.  5.  Apply the CHG Soap to your body ONLY FROM THE NECK DOWN.   Do not use on face/ open                           Wound or open sores. Avoid contact with eyes, ears mouth and genitals (private parts).                       Wash face,  Genitals (private parts) with your normal soap.             6.  Wash thoroughly, paying special attention to the area where your surgery  will be performed.  7.  Thoroughly  rinse your body with warm water from the neck down.  8.  DO NOT shower/wash with your normal soap after using and rinsing off  the CHG Soap.                9.  Pat yourself dry with a clean towel.            10.  Wear clean pajamas.            11.  Place clean sheets on your bed the night of your first shower and do not  sleep with pets. Day of Surgery : Do not apply any lotions/deodorants the morning of surgery.  Please wear clean clothes to the hospital/surgery center.  FAILURE TO FOLLOW THESE INSTRUCTIONS MAY RESULT IN THE CANCELLATION OF YOUR SURGERY PATIENT SIGNATURE_________________________________  NURSE SIGNATURE__________________________________  ________________________________________________________________________   WHAT IS A BLOOD TRANSFUSION? Blood Transfusion Information  A transfusion is the replacement of blood or some of its parts. Blood is made up of multiple cells which provide different functions. Red blood cells carry oxygen and are used for blood loss replacement. White blood cells fight against infection. Platelets control bleeding. Plasma helps clot blood. Other blood products are available  for specialized needs, such as hemophilia or other clotting disorders. BEFORE THE TRANSFUSION  Who gives blood for transfusions?  Healthy volunteers who are fully evaluated to make sure their blood is safe. This is blood bank blood. Transfusion therapy is the safest it has ever been in the practice of medicine. Before blood is taken from a donor, a complete history is taken to make sure that person has no history of diseases nor engages in risky social behavior (examples are intravenous drug use or sexual activity with multiple partners). The donor's travel history is screened to minimize risk of transmitting infections, such as malaria. The donated blood is tested for signs of infectious diseases, such as HIV and hepatitis. The blood is then tested to be sure it is compatible with you in order to minimize the chance of a transfusion reaction. If you or a relative donates blood, this is often done in anticipation of surgery and is not appropriate for emergency situations. It takes many days to process the donated blood. RISKS AND COMPLICATIONS Although transfusion therapy is very safe and saves many lives, the main dangers of transfusion include:  Getting an infectious disease. Developing a transfusion reaction. This is an allergic reaction to something in the blood you were given. Every precaution is taken to prevent this. The decision to have a blood transfusion has been considered carefully by your caregiver before blood is given. Blood is not given unless the benefits outweigh the risks. AFTER THE TRANSFUSION Right after receiving a blood transfusion, you will usually feel much better and more energetic. This is especially true if your red blood cells have gotten low (anemic). The transfusion raises the level of the red blood cells which carry oxygen, and this usually causes an energy increase. The nurse administering the transfusion will monitor you carefully for complications. HOME CARE  INSTRUCTIONS  No special instructions are needed after a transfusion. You may find your energy is better. Speak with your caregiver about any limitations on activity for underlying diseases you may have. SEEK MEDICAL CARE IF:  Your condition is not improving after your transfusion. You develop redness or irritation at the intravenous (IV) site. SEEK IMMEDIATE MEDICAL CARE IF:  Any of the following symptoms occur over the next 12 hours: Shaking chills. You  have a temperature by mouth above 102 F (38.9 C), not controlled by medicine. Chest, back, or muscle pain. People around you feel you are not acting correctly or are confused. Shortness of breath or difficulty breathing. Dizziness and fainting. You get a rash or develop hives. You have a decrease in urine output. Your urine turns a dark color or changes to pink, red, or brown. Any of the following symptoms occur over the next 10 days: You have a temperature by mouth above 102 F (38.9 C), not controlled by medicine. Shortness of breath. Weakness after normal activity. The white part of the eye turns yellow (jaundice). You have a decrease in the amount of urine or are urinating less often. Your urine turns a dark color or changes to pink, red, or brown. Document Released: 04/27/2000 Document Revised: 07/23/2011 Document Reviewed: 12/15/2007 [facility] Patient Information 2014 [place], [place].  _______________________________________________________________________

## 2023-04-23 ENCOUNTER — Encounter (HOSPITAL_COMMUNITY): Payer: Self-pay

## 2023-04-23 ENCOUNTER — Encounter (HOSPITAL_COMMUNITY)
Admission: RE | Admit: 2023-04-23 | Discharge: 2023-04-23 | Disposition: A | Discharge status: Home / Self Care | Payer: 59 | Source: Ambulatory Visit | Attending: Urology | Admitting: Urology

## 2023-04-23 ENCOUNTER — Other Ambulatory Visit: Payer: Self-pay

## 2023-04-23 DIAGNOSIS — Z01818 Encounter for other preprocedural examination: Secondary | ICD-10-CM | POA: Diagnosis present

## 2023-04-23 DIAGNOSIS — Z01812 Encounter for preprocedural laboratory examination: Secondary | ICD-10-CM | POA: Diagnosis not present

## 2023-04-23 HISTORY — DX: Malignant (primary) neoplasm, unspecified: C80.1

## 2023-04-23 LAB — CBC
HCT: 37.6 % — ABNORMAL LOW (ref 39.0–52.0)
Hemoglobin: 12.3 g/dL — ABNORMAL LOW (ref 13.0–17.0)
MCH: 29.9 pg (ref 26.0–34.0)
MCHC: 32.7 g/dL (ref 30.0–36.0)
MCV: 91.3 fL (ref 80.0–100.0)
Platelets: 242 10*3/uL (ref 150–400)
RBC: 4.12 MIL/uL — ABNORMAL LOW (ref 4.22–5.81)
RDW: 13.8 % (ref 11.5–15.5)
WBC: 8.9 10*3/uL (ref 4.0–10.5)
nRBC: 0 % (ref 0.0–0.2)

## 2023-04-23 LAB — BASIC METABOLIC PANEL
Anion gap: 9 (ref 5–15)
BUN: 12 mg/dL (ref 6–20)
CO2: 24 mmol/L (ref 22–32)
Calcium: 8.9 mg/dL (ref 8.9–10.3)
Chloride: 105 mmol/L (ref 98–111)
Creatinine, Ser: 0.95 mg/dL (ref 0.61–1.24)
GFR, Estimated: >60 mL/min (ref >60)
Glucose, Bld: 95 mg/dL (ref 70–99)
Potassium: 3.6 mmol/L (ref 3.5–5.1)
Sodium: 138 mmol/L (ref 135–145)

## 2023-04-23 NOTE — Progress Notes (Signed)
For Anesthesia: PCP - Merri Brunette, MD  Cardiologist - N/A  Bowel Prep reminder: Reviewed  Chest x-ray -  EKG -  Stress Test -  ECHO -  Cardiac Cath -  Pacemaker/ICD device last checked: Pacemaker orders received: Device Rep notified:  Spinal Cord Stimulator: N/A  Sleep Study - N/A CPAP -   Fasting Blood Sugar - N/A Checks Blood Sugar _____ times a day Date and result of last Hgb A1c-  Last dose of GLP1 agonist- N/A GLP1 instructions:   Last dose of SGLT-2 inhibitors- N/A SGLT-2 instructions:   Blood Thinner Instructions:N/A Aspirin Instructions: Last Dose:  Activity level: Can go up a flight of stairs and activities of daily living without stopping and without chest pain and/or shortness of breath   Able to exercise without chest pain and/or shortness of breath  Anesthesia review: Hx: Smoker  Patient denies shortness of breath, fever, cough and chest pain at PAT appointment   Patient verbalized understanding of instructions that were given to them at the PAT appointment. Patient was also instructed that they will need to review over the PAT instructions again at home before surgery.

## 2023-04-26 ENCOUNTER — Other Ambulatory Visit (HOSPITAL_COMMUNITY): Payer: Self-pay

## 2023-04-30 ENCOUNTER — Other Ambulatory Visit (HOSPITAL_COMMUNITY): Payer: Self-pay

## 2023-05-01 NOTE — H&P (Signed)
Office Visit Report     04/16/2023   --------------------------------------------------------------------------------   Travis Simpson  MRN: [id number]  DOB: November 19, 1965, 57 year old Male  SSN:    PRIMARY CARE:  Travis Guardian, MD  PRIMARY CARE FAX:  [phone]  REFERRING:  Travis Guardian, MD  PROVIDER:  Rutherford Simpson, M.D.  TREATING:  Travis Simpson, [place]  LOCATION:  [facility] [phone]     --------------------------------------------------------------------------------   CC/HPI: Pt presents today for pre-operative history and physical exam in anticipation of robotic assisted lap radical prostatectomy with bilateral pelvic lymph node dissection by Travis Simpson on 05/02/23. He is doing well and is without complaint.   Pt denies F/C, HA, CP, SOB, N/V, diarrhea/constipation, back pain, flank pain, hematuria, and dysuria.     HX:   CC: Prostate Cancer   Physician requesting consult: Travis Simpson  PCP: Travis Simpson  Location of consult: [facility]   [name] is a 57 year old healthy gentleman who was found to have an elevated PSA of 5.91. This prompted an MRI of the prostate on 01/10/23 that indicated a 1.3 cm PI-RADS 4 lesion of the left anterior mid gland. An MR/US fusion biopsy was performed on 01/25/23 and confirmed Gleason 3+4=7 adenocarcinoma with all 4 targeted biopsies positive and 3 out of 12 systematic biopsies positive for malignancy.   Family history: None.   Imaging studies: MRI (01/10/23) - No EPE, SVI, LAD, or bone lesions.   PMH: He has no major medical comorbid conditions.  PSH: No abdominal surgeries.   TNM stage: cT1c N0 Mx  PSA: 5.91  Gleason score: 3+4=7 (GG 2)  Biopsy (01/25/23): 7/16 cores positive  Left: L lateral apex (20%, 3+3=6), L apex (40%, 3+4=7), L lateral mid (30%, 3+4=7)  Right: Benign  ROI: 4/4 cores (60%, 60%, 50%, 50%, 3+4=7)   Prostate volume: 31.2 cc   Nomogram  OC disease: 62%  EPE: 36%  SVI: 3%  LNI: 4%  PFS (5 year, 10 year): 81%, 68%   Urinary function: IPSS is 9.  Erectile function: SHIM score is 9.     ALLERGIES: No Known Drug Allergies    MEDICATIONS: Sildenafil Citrate 100 mg tablet 1 tablet PO prn Take 1 tab po 1hr prior to sexual activities     GU PSH: Prostate Needle Biopsy - 01/25/2023       PSH Notes: left knee scope    NON-GU PSH: Back surgery Neck Surgery Surgical Pathology, Gross And Microscopic Examination For Prostate Needle - 01/25/2023 Visit Complexity (formerly GPC1X) - 02/05/2023, 11/22/2022     GU PMH: Stress Incontinence - 03/27/2023 Prostate Cancer - 03/06/2023, - 02/19/2023, - 02/05/2023 Elevated PSA - 01/25/2023, - 11/22/2022 BPH w/o LUTS - 11/22/2022 ED due to arterial insufficiency (Stable) - 11/22/2022, - 2021 Encounter for Prostate Cancer screening (Stable) - 11/22/2022, - 2021    NON-GU PMH: Muscle weakness (generalized) - 03/27/2023, - 03/06/2023 Other muscle spasm - 03/27/2023    FAMILY HISTORY: 1 son - Other lupus - Sister   SOCIAL HISTORY: Marital Status: Married Preferred Language: English; Race: Black or African American Current Smoking Status: Patient smokes occasionally.   Tobacco Use Assessment Completed: Used Tobacco in last 30 days? Does not use smokeless tobacco. Does drink.  Does not use drugs. Drinks 1 caffeinated drink per day. Has not had a blood transfusion. Patient's occupation is/was [employer].     Notes: smokes cigars  ETOH beer 2-3 per day; moonshine couple shots per day (but has recently cut back).   REVIEW OF SYSTEMS:    GU Review Male:   Patient reports hard to postpone urination and get up at night to urinate. Patient denies frequent urination, burning/ pain with urination, leakage of urine, stream starts and stops, trouble starting your stream, have to strain to urinate , erection problems, and penile pain.  Gastrointestinal  (Upper):   Patient denies nausea, vomiting, and indigestion/ heartburn.  Gastrointestinal (Lower):   Patient denies diarrhea and constipation.  Constitutional:   Patient denies fever, night sweats, weight loss, and fatigue.  Skin:   Patient denies skin rash/ lesion and itching.  Eyes:   Patient denies blurred vision and double vision.  Ears/ Nose/ Throat:   Patient denies sore throat and sinus problems.  Hematologic/Lymphatic:   Patient denies swollen glands and easy bruising.  Cardiovascular:   Patient denies leg swelling and chest pains.  Respiratory:   Patient denies cough and shortness of breath.  Endocrine:   Patient denies excessive thirst.  Musculoskeletal:   Patient denies back pain and joint pain.  Neurological:   Patient denies headaches and dizziness.  Psychologic:   Patient denies depression and anxiety.   VITAL SIGNS:      04/16/2023 03:12 PM  BP 135/86 mmHg  Pulse 80 /min  Temperature 97.3 F / 36.2 C   MULTI-SYSTEM PHYSICAL EXAMINATION:    Constitutional: Well-nourished. No physical deformities. Normally developed. Good grooming.  Neck: Neck symmetrical, not swollen. Normal tracheal position.  Respiratory: Normal breath sounds. No labored breathing, no use of accessory muscles.   Cardiovascular: Regular rate and rhythm. No murmur, no gallop.   Lymphatic: No enlargement of neck, axillae, groin.  Skin: No paleness, no jaundice, no cyanosis. No lesion, no ulcer, no rash.  Neurologic / Psychiatric: Oriented to time, oriented to place, oriented to person. No depression, no anxiety, no agitation.  Gastrointestinal: No mass, no tenderness, no rigidity, non obese abdomen.  Eyes: Normal conjunctivae. Normal eyelids.  Ears, Nose, Mouth, and Throat: Left ear no scars, no lesions, no masses. Right ear no scars, no lesions, no masses. Nose no scars, no lesions, no masses. Normal hearing. Normal lips.  Musculoskeletal: Normal gait and station of head and neck.     Complexity of  Data:  Records Review:   Previous Patient Records  Urine Test Review:   Urinalysis   04/16/23  Urinalysis  Urine Appearance Clear   Urine Color Yellow   Urine Glucose Neg mg/dL  Urine Bilirubin Neg mg/dL  Urine Ketones Neg mg/dL  Urine Specific Gravity 1.020   Urine Blood Neg ery/uL  Urine pH 6.5   Urine Protein Neg mg/dL  Urine Urobilinogen 0.2 mg/dL  Urine Nitrites Neg   Urine Leukocyte Esterase Neg leu/uL   PROCEDURES:          Urinalysis - 81003 Dipstick Dipstick Cont'd  Color: Yellow Bilirubin: Neg mg/dL  Appearance: Clear Ketones: Neg mg/dL  Specific Gravity: 6.283 Blood: Neg ery/uL  pH: 6.5 Protein: Neg mg/dL  Glucose: Neg mg/dL Urobilinogen: 0.2 mg/dL    Nitrites: Neg    Leukocyte Esterase: Neg leu/uL    ASSESSMENT:      ICD-10 Details  1 GU:   Prostate Cancer - C61    PLAN:           Schedule Return Visit/Planned Activity: Keep Scheduled Appointment - Schedule Surgery          Document Letter(s):  Created  for Patient: Clinical Summary         Notes:   There are no changes in the patients history or physical exam since last evaluation by Travis Simpson. Pt is scheduled to undergo RALP with BPLND on 05/02/23.   All pt's questions were answered to the best of my ability.          Next Appointment:      Next Appointment: 05/02/2023 11:15 AM    Appointment Type: Surgery     Location: [facility] [phone]    Provider: Heloise Purpura, M.D.    Reason for Visit: WL/OBS RA LAP RAD PROSTATECOMY LEV 2 AND BPLND WITH AMANDA      * Signed by Travis Amor, PA on 04/16/23 at 4:00 PM (EST*

## 2023-05-02 ENCOUNTER — Ambulatory Visit (HOSPITAL_COMMUNITY): Payer: 59 | Admitting: Anesthesiology

## 2023-05-02 ENCOUNTER — Encounter (HOSPITAL_COMMUNITY)
Admission: RE | Disposition: A | Discharge status: Home / Self Care | Payer: Self-pay | Source: Ambulatory Visit | Attending: Urology

## 2023-05-02 ENCOUNTER — Other Ambulatory Visit (HOSPITAL_COMMUNITY): Payer: Self-pay

## 2023-05-02 ENCOUNTER — Encounter (HOSPITAL_COMMUNITY): Payer: Self-pay | Admitting: Urology

## 2023-05-02 ENCOUNTER — Observation Stay (HOSPITAL_COMMUNITY)
Admission: RE | Admit: 2023-05-02 | Discharge: 2023-05-03 | Disposition: A | Discharge status: Home / Self Care | Payer: 59 | Source: Ambulatory Visit | Attending: Urology | Admitting: Urology

## 2023-05-02 ENCOUNTER — Other Ambulatory Visit: Payer: Self-pay

## 2023-05-02 DIAGNOSIS — C61 Malignant neoplasm of prostate: Secondary | ICD-10-CM

## 2023-05-02 DIAGNOSIS — F17200 Nicotine dependence, unspecified, uncomplicated: Secondary | ICD-10-CM | POA: Insufficient documentation

## 2023-05-02 HISTORY — PX: LYMPHADENECTOMY: SHX5960

## 2023-05-02 HISTORY — PX: ROBOT ASSISTED LAPAROSCOPIC RADICAL PROSTATECTOMY: SHX5141

## 2023-05-02 LAB — HEMOGLOBIN AND HEMATOCRIT, BLOOD
HCT: 40.8 % (ref 39.0–52.0)
Hemoglobin: 13.3 g/dL (ref 13.0–17.0)

## 2023-05-02 LAB — BASIC METABOLIC PANEL
Anion gap: 9 (ref 5–15)
BUN: 14 mg/dL (ref 6–20)
CO2: 21 mmol/L — ABNORMAL LOW (ref 22–32)
Calcium: 8.5 mg/dL — ABNORMAL LOW (ref 8.9–10.3)
Chloride: 106 mmol/L (ref 98–111)
Creatinine, Ser: 0.64 mg/dL (ref 0.61–1.24)
GFR, Estimated: >60 mL/min (ref >60)
Glucose, Bld: 141 mg/dL — ABNORMAL HIGH (ref 70–99)
Potassium: 4.3 mmol/L (ref 3.5–5.1)
Sodium: 136 mmol/L (ref 135–145)

## 2023-05-02 LAB — TYPE AND SCREEN
ABO/RH(D): O POS
Antibody Screen: NEGATIVE

## 2023-05-02 SURGERY — XI ROBOTIC ASSISTED LAPAROSCOPIC RADICAL PROSTATECTOMY LEVEL 2
Anesthesia: General

## 2023-05-02 MED ORDER — PROPOFOL 10 MG/ML IV BOLUS
INTRAVENOUS | Status: AC
  Filled 2023-05-02: qty 20

## 2023-05-02 MED ORDER — SULFAMETHOXAZOLE-TRIMETHOPRIM 800-160 MG PO TABS
1.0000 | ORAL_TABLET | Freq: Two times a day (BID) | ORAL | 0 refills | Status: DC
  Filled 2023-05-02: qty 6, 3d supply, fill #0

## 2023-05-02 MED ORDER — HYDROMORPHONE HCL 1 MG/ML IJ SOLN
0.2500 mg | INTRAMUSCULAR | Status: DC | PRN
  Administered 2023-05-02: 0.5 mg via INTRAVENOUS
  Administered 2023-05-02: 0.25 mg via INTRAVENOUS
  Administered 2023-05-02: 0.5 mg via INTRAVENOUS

## 2023-05-02 MED ORDER — MIDAZOLAM HCL 2 MG/2ML IJ SOLN
INTRAMUSCULAR | Status: AC
Start: 2023-05-02 — End: ?
  Filled 2023-05-02: qty 2

## 2023-05-02 MED ORDER — ONDANSETRON HCL 4 MG/2ML IJ SOLN
INTRAMUSCULAR | Status: AC
  Filled 2023-05-02: qty 2

## 2023-05-02 MED ORDER — FENTANYL CITRATE (PF) 100 MCG/2ML IJ SOLN
INTRAMUSCULAR | Status: DC | PRN
  Administered 2023-05-02 (×2): 25 ug via INTRAVENOUS
  Administered 2023-05-02: 50 ug via INTRAVENOUS
  Administered 2023-05-02: 100 ug via INTRAVENOUS

## 2023-05-02 MED ORDER — LIDOCAINE 2% (20 MG/ML) 5 ML SYRINGE
INTRAMUSCULAR | Status: DC | PRN
  Administered 2023-05-02: 60 mg via INTRAVENOUS
  Administered 2023-05-02: 1.5 mg/kg/h via INTRAVENOUS

## 2023-05-02 MED ORDER — CEFAZOLIN SODIUM-DEXTROSE 1-4 GM/50ML-% IV SOLN
1.0000 g | Freq: Three times a day (TID) | INTRAVENOUS | Status: AC
  Administered 2023-05-02 – 2023-05-03 (×2): 1 g via INTRAVENOUS
  Filled 2023-05-02 (×2): qty 50

## 2023-05-02 MED ORDER — HYOSCYAMINE SULFATE 0.125 MG SL SUBL: 0.1250 mg | SUBLINGUAL_TABLET | Freq: Four times a day (QID) | SUBLINGUAL | Status: DC | PRN

## 2023-05-02 MED ORDER — LACTATED RINGERS IV SOLN: INTRAVENOUS | Status: DC

## 2023-05-02 MED ORDER — HYDROMORPHONE HCL 1 MG/ML IJ SOLN
INTRAMUSCULAR | Status: AC
  Administered 2023-05-02: 0.25 mg via INTRAVENOUS
  Filled 2023-05-02: qty 1

## 2023-05-02 MED ORDER — DIPHENHYDRAMINE HCL 12.5 MG/5ML PO ELIX: 12.5000 mg | ORAL_SOLUTION | Freq: Four times a day (QID) | ORAL | Status: DC | PRN

## 2023-05-02 MED ORDER — HYDROMORPHONE HCL 1 MG/ML IJ SOLN
INTRAMUSCULAR | Status: AC
  Filled 2023-05-02: qty 1

## 2023-05-02 MED ORDER — MIDAZOLAM HCL 5 MG/5ML IJ SOLN
INTRAMUSCULAR | Status: DC | PRN
  Administered 2023-05-02: 2 mg via INTRAVENOUS

## 2023-05-02 MED ORDER — DEXAMETHASONE SODIUM PHOSPHATE 10 MG/ML IJ SOLN
INTRAMUSCULAR | Status: AC
  Filled 2023-05-02: qty 1

## 2023-05-02 MED ORDER — TRAMADOL HCL 50 MG PO TABS
50.0000 mg | ORAL_TABLET | Freq: Four times a day (QID) | ORAL | 0 refills | Status: DC | PRN
  Filled 2023-05-02: qty 20, 3d supply, fill #0

## 2023-05-02 MED ORDER — ORAL CARE MOUTH RINSE: 15.0000 mL | Freq: Once | OROMUCOSAL | Status: AC

## 2023-05-02 MED ORDER — OXYCODONE HCL 5 MG/5ML PO SOLN: 5.0000 mg | Freq: Once | ORAL | Status: AC | PRN

## 2023-05-02 MED ORDER — DOCUSATE SODIUM 100 MG PO CAPS
100.0000 mg | ORAL_CAPSULE | Freq: Two times a day (BID) | ORAL | Status: DC
  Administered 2023-05-02 – 2023-05-03 (×2): 100 mg via ORAL
  Filled 2023-05-02 (×2): qty 1

## 2023-05-02 MED ORDER — EPHEDRINE SULFATE-NACL 50-0.9 MG/10ML-% IV SOSY
PREFILLED_SYRINGE | INTRAVENOUS | Status: DC | PRN
  Administered 2023-05-02 (×2): 5 mg via INTRAVENOUS

## 2023-05-02 MED ORDER — BUPIVACAINE-EPINEPHRINE 0.25% -1:200000 IJ SOLN
INTRAMUSCULAR | Status: DC | PRN
  Administered 2023-05-02: 30 mL

## 2023-05-02 MED ORDER — KETOROLAC TROMETHAMINE 15 MG/ML IJ SOLN
15.0000 mg | Freq: Four times a day (QID) | INTRAMUSCULAR | Status: DC
  Administered 2023-05-02 – 2023-05-03 (×4): 15 mg via INTRAVENOUS
  Filled 2023-05-02 (×4): qty 1

## 2023-05-02 MED ORDER — ONDANSETRON HCL 4 MG/2ML IJ SOLN: 4.0000 mg | INTRAMUSCULAR | Status: DC | PRN

## 2023-05-02 MED ORDER — PROPOFOL 10 MG/ML IV BOLUS
INTRAVENOUS | Status: DC | PRN
  Administered 2023-05-02: 200 mg via INTRAVENOUS

## 2023-05-02 MED ORDER — LIDOCAINE HCL (PF) 2 % IJ SOLN
INTRAMUSCULAR | Status: AC
  Filled 2023-05-02: qty 5

## 2023-05-02 MED ORDER — CHLORHEXIDINE GLUCONATE 0.12 % MT SOLN
15.0000 mL | Freq: Once | OROMUCOSAL | Status: AC
  Administered 2023-05-02: 15 mL via OROMUCOSAL

## 2023-05-02 MED ORDER — ONDANSETRON HCL 4 MG/2ML IJ SOLN: 4.0000 mg | Freq: Four times a day (QID) | INTRAMUSCULAR | Status: DC | PRN

## 2023-05-02 MED ORDER — MORPHINE SULFATE (PF) 2 MG/ML IV SOLN
2.0000 mg | INTRAVENOUS | Status: DC | PRN
Start: 2023-05-02 — End: 2023-05-03
  Administered 2023-05-02: 2 mg via INTRAVENOUS
  Administered 2023-05-02: 4 mg via INTRAVENOUS
  Administered 2023-05-03: 2 mg via INTRAVENOUS
  Filled 2023-05-02: qty 1
  Filled 2023-05-02: qty 2
  Filled 2023-05-02: qty 1

## 2023-05-02 MED ORDER — KCL IN DEXTROSE-NACL 20-5-0.45 MEQ/L-%-% IV SOLN
INTRAVENOUS | Status: DC
  Filled 2023-05-02 (×2): qty 1000

## 2023-05-02 MED ORDER — FENTANYL CITRATE PF 50 MCG/ML IJ SOSY
PREFILLED_SYRINGE | INTRAMUSCULAR | Status: AC
  Administered 2023-05-02: 25 ug via INTRAVENOUS
  Filled 2023-05-02: qty 3

## 2023-05-02 MED ORDER — PHENYLEPHRINE HCL-NACL 20-0.9 MG/250ML-% IV SOLN
INTRAVENOUS | Status: DC | PRN
  Administered 2023-05-02: 25 ug/min via INTRAVENOUS

## 2023-05-02 MED ORDER — ZOLPIDEM TARTRATE 5 MG PO TABS: 5.0000 mg | ORAL_TABLET | Freq: Every evening | ORAL | Status: DC | PRN

## 2023-05-02 MED ORDER — SODIUM CHLORIDE 0.9 % IV BOLUS
1000.0000 mL | Freq: Once | INTRAVENOUS | Status: AC
  Administered 2023-05-02: 1000 mL via INTRAVENOUS

## 2023-05-02 MED ORDER — TRIPLE ANTIBIOTIC 3.5-400-5000 EX OINT: 1.0000 | TOPICAL_OINTMENT | Freq: Three times a day (TID) | CUTANEOUS | Status: DC | PRN

## 2023-05-02 MED ORDER — STERILE WATER FOR IRRIGATION IR SOLN
Status: DC | PRN
  Administered 2023-05-02: 1000 mL

## 2023-05-02 MED ORDER — DOCUSATE SODIUM 100 MG PO CAPS: 100.0000 mg | ORAL_CAPSULE | Freq: Two times a day (BID) | ORAL | Status: DC

## 2023-05-02 MED ORDER — FENTANYL CITRATE PF 50 MCG/ML IJ SOSY
25.0000 ug | PREFILLED_SYRINGE | INTRAMUSCULAR | Status: DC | PRN
  Administered 2023-05-02: 50 ug via INTRAVENOUS
  Administered 2023-05-02: 25 ug via INTRAVENOUS
  Administered 2023-05-02: 50 ug via INTRAVENOUS

## 2023-05-02 MED ORDER — DIPHENHYDRAMINE HCL 50 MG/ML IJ SOLN
12.5000 mg | Freq: Four times a day (QID) | INTRAMUSCULAR | Status: DC | PRN
Start: 2023-05-02 — End: 2023-05-03

## 2023-05-02 MED ORDER — SUGAMMADEX SODIUM 200 MG/2ML IV SOLN
INTRAVENOUS | Status: DC | PRN
  Administered 2023-05-02: 200 mg via INTRAVENOUS

## 2023-05-02 MED ORDER — ACETAMINOPHEN 325 MG PO TABS: 650.0000 mg | ORAL_TABLET | ORAL | Status: DC | PRN

## 2023-05-02 MED ORDER — ROCURONIUM BROMIDE 10 MG/ML (PF) SYRINGE
PREFILLED_SYRINGE | INTRAVENOUS | Status: DC | PRN
  Administered 2023-05-02: 50 mg via INTRAVENOUS
  Administered 2023-05-02 (×2): 20 mg via INTRAVENOUS

## 2023-05-02 MED ORDER — ONDANSETRON HCL 4 MG/2ML IJ SOLN
INTRAMUSCULAR | Status: DC | PRN
  Administered 2023-05-02: 4 mg via INTRAVENOUS

## 2023-05-02 MED ORDER — CEFAZOLIN SODIUM-DEXTROSE 2-4 GM/100ML-% IV SOLN
2.0000 g | INTRAVENOUS | Status: AC
  Administered 2023-05-02: 2 g via INTRAVENOUS
  Filled 2023-05-02: qty 100

## 2023-05-02 MED ORDER — FENTANYL CITRATE (PF) 100 MCG/2ML IJ SOLN
INTRAMUSCULAR | Status: AC
  Filled 2023-05-02: qty 2

## 2023-05-02 MED ORDER — DEXAMETHASONE SODIUM PHOSPHATE 10 MG/ML IJ SOLN
INTRAMUSCULAR | Status: DC | PRN
  Administered 2023-05-02: 8 mg via INTRAVENOUS

## 2023-05-02 MED ORDER — ROCURONIUM BROMIDE 10 MG/ML (PF) SYRINGE
PREFILLED_SYRINGE | INTRAVENOUS | Status: AC
  Filled 2023-05-02: qty 10

## 2023-05-02 MED ORDER — ACETAMINOPHEN 10 MG/ML IV SOLN
INTRAVENOUS | Status: AC
  Filled 2023-05-02: qty 100

## 2023-05-02 MED ORDER — ACETAMINOPHEN 10 MG/ML IV SOLN
INTRAVENOUS | Status: DC | PRN
  Administered 2023-05-02: 1000 mg via INTRAVENOUS

## 2023-05-02 MED ORDER — EPHEDRINE 5 MG/ML INJ
INTRAVENOUS | Status: AC
  Filled 2023-05-02: qty 5

## 2023-05-02 MED ORDER — OXYCODONE HCL 5 MG PO TABS
ORAL_TABLET | ORAL | Status: AC
  Filled 2023-05-02: qty 1

## 2023-05-02 MED ORDER — OXYCODONE HCL 5 MG PO TABS
5.0000 mg | ORAL_TABLET | Freq: Once | ORAL | Status: AC | PRN
Start: 2023-05-02 — End: 2023-05-02
  Administered 2023-05-02: 5 mg via ORAL

## 2023-05-02 MED ORDER — BUPIVACAINE-EPINEPHRINE 0.25% -1:200000 IJ SOLN
INTRAMUSCULAR | Status: AC
  Filled 2023-05-02: qty 1

## 2023-05-02 SURGICAL SUPPLY — 58 items
APPLICATOR COTTON TIP 6 STRL (MISCELLANEOUS) ×2 IMPLANT
APPLICATOR COTTON TIP 6IN STRL (MISCELLANEOUS) ×2
BAG COUNTER SPONGE SURGICOUNT (BAG) IMPLANT
CATH FOLEY 2WAY SLVR 18FR 30CC (CATHETERS) ×2 IMPLANT
CATH ROBINSON RED A/P 16FR (CATHETERS) ×2 IMPLANT
CATH ROBINSON RED A/P 8FR (CATHETERS) ×2 IMPLANT
CATH TIEMANN FOLEY 18FR 5CC (CATHETERS) ×2 IMPLANT
CHLORAPREP W/TINT 26 (MISCELLANEOUS) ×2 IMPLANT
CLIP LIGATING HEM O LOK PURPLE (MISCELLANEOUS) ×2 IMPLANT
COVER SURGICAL LIGHT HANDLE (MISCELLANEOUS) ×2 IMPLANT
COVER TIP SHEARS 8 DVNC (MISCELLANEOUS) ×2 IMPLANT
CUTTER ECHEON FLEX ENDO 45 340 (ENDOMECHANICALS) ×2 IMPLANT
DERMABOND ADVANCED .7 DNX12 (GAUZE/BANDAGES/DRESSINGS) ×2 IMPLANT
DRAIN CHANNEL RND F F (WOUND CARE) IMPLANT
DRAPE ARM DVNC X/XI (DISPOSABLE) ×8 IMPLANT
DRAPE COLUMN DVNC XI (DISPOSABLE) ×2 IMPLANT
DRAPE SURG IRRIG POUCH 19X23 (DRAPES) ×2 IMPLANT
DRIVER NDL LRG 8 DVNC XI (INSTRUMENTS) ×4 IMPLANT
DRIVER NDLE LRG 8 DVNC XI (INSTRUMENTS) ×4
DRSG TEGADERM 4X4.75 (GAUZE/BANDAGES/DRESSINGS) ×2 IMPLANT
ELECT PENCIL ROCKER SW 15FT (MISCELLANEOUS) ×2 IMPLANT
ELECT REM PT RETURN 15FT ADLT (MISCELLANEOUS) ×2 IMPLANT
FORCEPS BPLR LNG DVNC XI (INSTRUMENTS) ×2 IMPLANT
FORCEPS PROGRASP DVNC XI (FORCEP) ×2 IMPLANT
GAUZE SPONGE 4X4 12PLY STRL (GAUZE/BANDAGES/DRESSINGS) ×2 IMPLANT
GLOVE BIO SURGEON STRL SZ 6.5 (GLOVE) ×2 IMPLANT
GLOVE SURG LX STRL 7.5 STRW (GLOVE) ×4 IMPLANT
GOWN STRL REUS W/ TWL XL LVL3 (GOWN DISPOSABLE) ×4 IMPLANT
GOWN STRL SURGICAL XL XLNG (GOWN DISPOSABLE) ×2 IMPLANT
HOLDER FOLEY CATH W/STRAP (MISCELLANEOUS) ×2 IMPLANT
IRRIG SUCT STRYKERFLOW 2 WTIP (MISCELLANEOUS) ×2
IRRIGATION SUCT STRKRFLW 2 WTP (MISCELLANEOUS) ×2 IMPLANT
IV LACTATED RINGERS 1000ML (IV SOLUTION) ×2 IMPLANT
KIT TURNOVER KIT A (KITS) IMPLANT
NDL SAFETY ECLIPSE 18X1.5 (NEEDLE) IMPLANT
PACK ROBOT UROLOGY CUSTOM (CUSTOM PROCEDURE TRAY) ×2 IMPLANT
PLUG CATH AND CAP STRL 200 (CATHETERS) ×2 IMPLANT
RELOAD STAPLE 45 4.1 GRN THCK (STAPLE) ×2 IMPLANT
SCISSORS MNPLR CVD DVNC XI (INSTRUMENTS) ×2 IMPLANT
SEAL UNIV 5-12 XI (MISCELLANEOUS) ×8 IMPLANT
SET CYSTO W/LG BORE CLAMP LF (SET/KITS/TRAYS/PACK) IMPLANT
SET TUBE SMOKE EVAC HIGH FLOW (TUBING) ×2 IMPLANT
SOL ELECTROSURG ANTI STICK (MISCELLANEOUS) ×2
SOL PREP POV-IOD 4OZ 10% (MISCELLANEOUS) ×2 IMPLANT
SOLUTION ELECTROSURG ANTI STCK (MISCELLANEOUS) ×2 IMPLANT
SPIKE FLUID TRANSFER (MISCELLANEOUS) ×2 IMPLANT
STAPLE RELOAD 45 GRN (STAPLE) ×2
SUT ETHILON 3 0 PS 1 (SUTURE) ×2 IMPLANT
SUT MNCRL 3 0 RB1 (SUTURE) ×2 IMPLANT
SUT MNCRL 3 0 VIOLET RB1 (SUTURE) ×2 IMPLANT
SUT MNCRL AB 4-0 PS2 18 (SUTURE) ×4 IMPLANT
SUT PDS PLUS AB 0 CT-2 (SUTURE) ×4 IMPLANT
SUT VIC AB 0 CT1 27XBRD ANTBC (SUTURE) ×4 IMPLANT
SUT VIC AB 2-0 SH 27X BRD (SUTURE) ×2 IMPLANT
SUT VIC AB 3-0 SH 27X BRD (SUTURE) IMPLANT
SYR 27GX1/2 1ML LL SAFETY (SYRINGE) ×2 IMPLANT
TROCAR Z THREAD OPTICAL 12X100 (TROCAR) IMPLANT
WATER STERILE IRR 1000ML POUR (IV SOLUTION) ×2 IMPLANT

## 2023-05-02 NOTE — Progress Notes (Signed)
Patient ID: Travis Simpson, male   DOB: 16-May-1965, 57 y.o.   MRN: [id number]  Post-op note  Subjective: The patient is doing well.  No complaints.  Objective: Vital signs in last 24 hours: Temp:  [97.7 F (36.5 C)-98.5 F (36.9 C)] 98.5 F (36.9 C) (12/19 1543) Pulse Rate:  [71-94] 93 (12/19 1543) Resp:  [13-20] 20 (12/19 1543) BP: (119-160)/(89-104) 119/90 (12/19 1543) SpO2:  [97 %-100 %] 97 % (12/19 1543) Weight:  [88 kg] 88 kg (12/19 0903)  Intake/Output from previous day: No intake/output data recorded. Intake/Output this shift: Total I/O In: 1720 [I.V.:1520; IV Piggyback:200] Out: 290 [Urine:160; Drains:30; Blood:100]  Physical Exam:  General: Alert and oriented. Abdomen: Soft, Nondistended. Incisions: Clean and dry. GU: Urine clearing.  Lab Results: Recent Labs    05/02/23 1313  HGB 13.3  HCT 40.8    Assessment/Plan: POD#0   1) Continue to monitor, ambulate, IS   Travis Simpson. MD   LOS: 0 days   Travis Simpson 05/02/2023, 5:04 PM

## 2023-05-02 NOTE — Anesthesia Preprocedure Evaluation (Signed)
Anesthesia Evaluation  Patient identified by MRN, date of birth, ID band Patient awake    Reviewed: Allergy & Precautions, H&P , NPO status , Patient's Chart, lab work & pertinent test results  Airway Mallampati: II   Neck ROM: full    Dental   Pulmonary Current Smoker   breath sounds clear to auscultation       Cardiovascular negative cardio ROS  Rhythm:regular Rate:Normal     Neuro/Psych    GI/Hepatic   Endo/Other    Renal/GU      Musculoskeletal   Abdominal   Peds  Hematology   Anesthesia Other Findings   Reproductive/Obstetrics                             Anesthesia Physical Anesthesia Plan  ASA: 2  Anesthesia Plan: General   Post-op Pain Management:    Induction: Intravenous  PONV Risk Score and Plan: 1 and Ondansetron, Dexamethasone, Midazolam and Treatment may vary due to age or medical condition  Airway Management Planned: Oral ETT  Additional Equipment:   Intra-op Plan:   Post-operative Plan: Extubation in OR  Informed Consent: I have reviewed the patients History and Physical, chart, labs and discussed the procedure including the risks, benefits and alternatives for the proposed anesthesia with the patient or authorized representative who has indicated his/her understanding and acceptance.     Dental advisory given  Plan Discussed with: CRNA, Anesthesiologist and Surgeon  Anesthesia Plan Comments:        Anesthesia Quick Evaluation

## 2023-05-02 NOTE — Discharge Instructions (Signed)

## 2023-05-02 NOTE — TOC Initial Note (Signed)
Transition of Care [facility]) - Initial/Assessment Note    Patient Details  Name: Travis Simpson MRN: [id number] Date of Birth: Sep 10, 1965  Transition of Care [facility]) CM/SW Contact:    Lanier Clam, RN Phone Number: 05/02/2023, 3:53 PM  Clinical Narrative:  d/c plan home.                 Expected Discharge Plan: Home/Self Care Barriers to Discharge: Continued Medical Work up   Patient Goals and CMS Choice Patient states their goals for this hospitalization and ongoing recovery are:: Home CMS Medicare.gov Compare Post Acute Care list provided to:: Patient   [place] ownership interest in [facility].provided to:: Spouse    Expected Discharge Plan and Services   Discharge Planning Services: CM Consult Post Acute Care Choice: Resumption of Svcs/PTA Provider Living arrangements for the past 2 months: Single Family Home                                      Prior Living Arrangements/Services Living arrangements for the past 2 months: Single Family Home Lives with:: Spouse                   Activities of Daily Living      Permission Sought/Granted                  Emotional Assessment              Admission diagnosis:  Prostate cancer [facility]) [C61] Patient Active Problem List   Diagnosis Date Noted   Prostate cancer (HCC) 05/02/2023   Prostatic adenocarcinoma (HCC) 02/18/2023   Leukocytosis 03/18/2014   Smoker 03/18/2014   Oral hygiene poor 03/18/2014   Back pain 07/29/2013   PCP:  Merri Brunette, MD Pharmacy:   [facility] [address] [address] Phone: [phone] Fax: [phone]     Social Drivers of Health (SDOH) Social History: SDOH Screenings   Food Insecurity: No Food Insecurity (02/19/2023)  Housing: Low Risk  (02/19/2023)  Transportation Needs: No Transportation Needs (02/19/2023)  Utilities: Not At Risk (02/19/2023)  Depression (PHQ2-9): Low Risk  (02/19/2023)   Tobacco Use: High Risk (05/02/2023)   SDOH Interventions:     Readmission Risk Interventions     No data to display

## 2023-05-02 NOTE — Transfer of Care (Signed)
Immediate Anesthesia Transfer of Care Note  Patient: Travis Simpson  Procedure(s) Performed: XI ROBOTIC ASSISTED LAPAROSCOPIC RADICAL PROSTATECTOMY LEVEL 2 BILATERAL PELVIC LYMPHADENECTOMY (Bilateral)  Patient Location: PACU  Anesthesia Type:General  Level of Consciousness: awake, alert , oriented, and patient cooperative  Airway & Oxygen Therapy: Patient Spontanous Breathing and Patient connected to face mask oxygen  Post-op Assessment: Report given to RN, Post -op Vital signs reviewed and stable, and Patient moving all extremities  Post vital signs: Reviewed and stable  Last Vitals:  Vitals Value Taken Time  BP 157/98 05/02/23 1256  Temp    Pulse 72 05/02/23 1259  Resp 17 05/02/23 1259  SpO2 100 % 05/02/23 1259  Vitals shown include unfiled device data.  Last Pain:  Vitals:   05/02/23 0903  TempSrc:   PainSc: 0-No pain         Complications: No notable events documented.

## 2023-05-02 NOTE — Anesthesia Procedure Notes (Signed)
Procedure Name: Intubation Date/Time: 05/02/2023 10:49 AM  Performed by: Elisabeth Cara, CRNAPre-anesthesia Checklist: Patient identified, Emergency Drugs available, Suction available, Patient being monitored and Timeout performed Patient Re-evaluated:Patient Re-evaluated prior to induction Oxygen Delivery Method: Circle system utilized Preoxygenation: Pre-oxygenation with 100% oxygen Induction Type: IV induction Ventilation: Mask ventilation without difficulty Laryngoscope Size: Mac and 4 Grade View: Grade I Tube type: Oral Tube size: 7.5 mm Number of attempts: 1 Airway Equipment and Method: Stylet Placement Confirmation: ETT inserted through vocal cords under direct vision, positive ETCO2 and breath sounds checked- equal and bilateral Secured at: 23 cm Tube secured with: Tape Dental Injury: Teeth and Oropharynx as per pre-operative assessment

## 2023-05-02 NOTE — Interval H&P Note (Signed)
History and Physical Interval Note:  05/02/2023 9:58 AM  Travis Simpson  has presented today for surgery, with the diagnosis of PROSTATE CANCER.  The various methods of treatment have been discussed with the patient and family. After consideration of risks, benefits and other options for treatment, the patient has consented to  Procedure(s) with comments: XI ROBOTIC ASSISTED LAPAROSCOPIC RADICAL PROSTATECTOMY LEVEL 2 (N/A) - 210 MINUTES NEEDED FOR CASE BILATERAL PELVIC LYMPHADENECTOMY (Bilateral) as a surgical intervention.  The patient's history has been reviewed, patient examined, no change in status, stable for surgery.  I have reviewed the patient's chart and labs.  Questions were answered to the patient's satisfaction.     [employer]

## 2023-05-02 NOTE — Op Note (Signed)
Preoperative diagnosis: Clinically localized adenocarcinoma of the prostate (clinical stage T1c Nx Mx)  Postoperative diagnosis: Clinically localized adenocarcinoma of the prostate (clinical stage T1c Nx Mx)  Procedure:  Robotic assisted laparoscopic radical prostatectomy (bilateral nerve sparing) Bilateral robotic assisted laparoscopic pelvic lymphadenectomy  Surgeon: Moody Bruins. M.D.  Assistant: Harrie Foreman, PA-C  An assistant was required for this surgical procedure.  The duties of the assistant included but were not limited to suctioning, passing suture, camera manipulation, retraction. This procedure would not be able to be performed without an Geophysicist/field seismologist.  Anesthesia: General  Complications: None  EBL: 100 mL  IVF:  1400 mL crystalloid  Specimens: Prostate and seminal vesicles Right pelvic lymph nodes Left pelvic lymph nodes  Disposition of specimens: Pathology  Drains: 20 Fr coude catheter # 19 Blake pelvic drain  Indication: Travis Simpson is a 57 y.o. year old patient with clinically localized prostate cancer.  After a thorough review of the management options for treatment of prostate cancer, he elected to proceed with surgical therapy and the above procedure(s).  We have discussed the potential benefits and risks of the procedure, side effects of the proposed treatment, the likelihood of the patient achieving the goals of the procedure, and any potential problems that might occur during the procedure or recuperation. Informed consent has been obtained.  Description of procedure:  The patient was taken to the operating room and a general anesthetic was administered. He was given preoperative antibiotics, placed in the dorsal lithotomy position, and prepped and draped in the usual sterile fashion. Next a preoperative timeout was performed. A urethral catheter was placed into the bladder and a site was selected near the umbilicus for placement of the camera  port. This was placed using a standard open Hassan technique which allowed entry into the peritoneal cavity under direct vision and without difficulty. An 8 mm robotic port was placed and a pneumoperitoneum established. The camera was then used to inspect the abdomen and there was no evidence of any intra-abdominal injuries or other abnormalities. The remaining abdominal ports were then placed. 8 mm robotic ports were placed in the right lower quadrant, left lower quadrant, and far left lateral abdominal wall. A 5 mm port was placed in the right upper quadrant and a 12 mm port was placed in the right lateral abdominal wall for laparoscopic assistance. All ports were placed under direct vision without difficulty. The surgical cart was then docked.   Utilizing the cautery scissors, the bladder was reflected posteriorly allowing entry into the space of Retzius and identification of the endopelvic fascia and prostate. The periprostatic fat was then removed from the prostate allowing full exposure of the endopelvic fascia. The endopelvic fascia was then incised from the apex back to the base of the prostate bilaterally and the underlying levator muscle fibers were swept laterally off the prostate thereby isolating the dorsal venous complex. The dorsal vein was then stapled and divided with a 45 mm Flex Echelon stapler. Attention then turned to the bladder neck which was divided anteriorly thereby allowing entry into the bladder and exposure of the urethral catheter. The catheter balloon was deflated and the catheter was brought into the operative field and used to retract the prostate anteriorly. The posterior bladder neck was then examined and was divided allowing further dissection between the bladder and prostate posteriorly until the vasa deferentia and seminal vessels were identified. The vasa deferentia were isolated, divided, and lifted anteriorly. The seminal vesicles were dissected down to  their tips with care  to control the seminal vascular arterial blood supply. These structures were then lifted anteriorly and the space between Denonvillier's fascia and the anterior rectum was developed with a combination of sharp and blunt dissection. This isolated the vascular pedicles of the prostate.  The lateral prostatic fascia was then sharply incised allowing release of the neurovascular bundles bilaterally. The vascular pedicles of the prostate were then ligated with Weck clips between the prostate and neurovascular bundles and divided with sharp cold scissor dissection resulting in neurovascular bundle preservation. The neurovascular bundles were then separated off the apex of the prostate and urethra bilaterally.  The urethra was then sharply transected allowing the prostate specimen to be disarticulated. The pelvis was copiously irrigated and hemostasis was ensured. There was no evidence for rectal injury.  Attention then turned to the right pelvic sidewall. The fibrofatty tissue between the external iliac vein, confluence of the iliac vessels, hypogastric artery, and Cooper's ligament was dissected free from the pelvic sidewall with care to preserve the obturator nerve. Weck clips were used for lymphostasis and hemostasis. An identical procedure was performed on the contralateral side and the lymphatic packets were removed for permanent pathologic analysis.  Attention then turned to the urethral anastomosis. A 2-0 Vicryl slip knot was placed between Denonvillier's fascia, the posterior bladder neck, and the posterior urethra to reapproximate these structures. A double-armed 3-0 Monocryl suture was then used to perform a 360 running tension-free anastomosis between the bladder neck and urethra. A new urethral catheter was then placed into the bladder and irrigated. There were no blood clots within the bladder and the anastomosis appeared to be watertight. A #19 Blake drain was then brought through the left lateral 8  mm port site and positioned appropriately within the pelvis. It was secured to the skin with a nylon suture. The surgical cart was then undocked. The right lateral 12 mm port site was closed at the fascial level with a 0 Vicryl suture placed laparoscopically. All remaining ports were then removed under direct vision. The prostate specimen was removed intact within the Endopouch retrieval bag via the periumbilical camera port site. This fascial opening was closed with two running 0 PDS sutures. 0.25% Marcaine was then injected into all port sites and all incisions were reapproximated at the skin level with 4-0 Monocryl subcuticular sutures and Dermabond. The patient appeared to tolerate the procedure well and without complications. The patient was able to be extubated and transferred to the recovery unit in satisfactory condition.   Moody Bruins MD

## 2023-05-03 ENCOUNTER — Other Ambulatory Visit (HOSPITAL_COMMUNITY): Payer: Self-pay

## 2023-05-03 ENCOUNTER — Encounter (HOSPITAL_COMMUNITY): Payer: Self-pay | Admitting: Urology

## 2023-05-03 DIAGNOSIS — C61 Malignant neoplasm of prostate: Secondary | ICD-10-CM | POA: Diagnosis not present

## 2023-05-03 DIAGNOSIS — F17200 Nicotine dependence, unspecified, uncomplicated: Secondary | ICD-10-CM | POA: Diagnosis not present

## 2023-05-03 LAB — HEMOGLOBIN AND HEMATOCRIT, BLOOD
HCT: 38.4 % — ABNORMAL LOW (ref 39.0–52.0)
Hemoglobin: 12.4 g/dL — ABNORMAL LOW (ref 13.0–17.0)

## 2023-05-03 MED ORDER — TRAMADOL HCL 50 MG PO TABS: 50.0000 mg | ORAL_TABLET | Freq: Four times a day (QID) | ORAL | Status: DC | PRN

## 2023-05-03 MED ORDER — CHLORHEXIDINE GLUCONATE CLOTH 2 % EX PADS
6.0000 | MEDICATED_PAD | Freq: Every day | CUTANEOUS | Status: DC
  Administered 2023-05-03: 6 via TOPICAL

## 2023-05-03 MED ORDER — BISACODYL 10 MG RE SUPP: 10.0000 mg | Freq: Once | RECTAL | Status: DC

## 2023-05-03 NOTE — Discharge Summary (Signed)
  Date of admission: 05/02/2023  Date of discharge: 05/03/2023  Admission diagnosis: Prostate Cancer  Discharge diagnosis: Prostate Cancer  History and Physical: For full details, please see admission history and physical. Briefly, Travis Simpson is a 57 y.o. gentleman with localized prostate cancer.  After discussing management/treatment options, he elected to proceed with surgical treatment.  Hospital Course: DEVARIUS STROMQUIST was taken to the operating room on 05/02/2023 and underwent a robotic assisted laparoscopic radical prostatectomy. He tolerated this procedure well and without complications. Postoperatively, he was able to be transferred to a regular hospital room following recovery from anesthesia.  He was able to begin ambulating the night of surgery. He remained hemodynamically stable overnight.  He had excellent urine output with appropriately minimal output from his pelvic drain and his pelvic drain was removed on POD #1.  He was transitioned to oral pain medication, tolerated a clear liquid diet, and had met all discharge criteria and was able to be discharged home later on POD#1.  Laboratory values:  Recent Labs    05/02/23 1313 05/03/23 0505  HGB 13.3 12.4*  HCT 40.8 38.4*    Disposition: Home  Discharge instruction: He was instructed to be ambulatory but to refrain from heavy lifting, strenuous activity, or driving. He was instructed on urethral catheter care.  Discharge medications:   Allergies as of 05/03/2023   No Known Allergies      Medication List     STOP taking these medications    multivitamin with minerals tablet   TURMERIC PO       TAKE these medications    docusate sodium 100 MG capsule Commonly known as: COLACE Take 1 capsule (100 mg total) by mouth 2 (two) times daily.   sildenafil 100 MG tablet Commonly known as: VIAGRA Take 1 tablet (100 mg total) by mouth 1 hour prior to sexual activities as needed.    sulfamethoxazole-trimethoprim 800-160 MG tablet Commonly known as: BACTRIM DS Take 1 tablet by mouth 2 (two) times daily. Start the day prior to foley removal appointment   traMADol 50 MG tablet Commonly known as: Ultram Take 1-2 tablets (50-100 mg total) by mouth every 6 (six) hours as needed for moderate pain (pain score 4-6) or severe pain (pain score 7-10).        Followup: He will followup in 1 week for catheter removal and to discuss his surgical pathology results.

## 2023-05-03 NOTE — TOC Transition Note (Signed)
Transition of Care [facility]) - Discharge Note   Patient Details  Name: Travis Simpson MRN: [id number] Date of Birth: 1965/07/02  Transition of Care [facility]) CM/SW Contact:  Lanier Clam, RN Phone Number: 05/03/2023, 12:24 PM   Clinical Narrative:  d/c home no needs.     Final next level of care: Home/Self Care Barriers to Discharge: No Barriers Identified   Patient Goals and CMS Choice Patient states their goals for this hospitalization and ongoing recovery are:: Home CMS Medicare.gov Compare Post Acute Care list provided to:: Patient   [place] ownership interest in [facility].provided to:: Spouse    Discharge Placement                       Discharge Plan and Services Additional resources added to the After Visit Summary for     Discharge Planning Services: CM Consult Post Acute Care Choice: Resumption of Svcs/PTA Provider                               Social Drivers of Health (SDOH) Interventions SDOH Screenings   Food Insecurity: No Food Insecurity (05/02/2023)  Housing: Low Risk  (05/02/2023)  Transportation Needs: No Transportation Needs (05/02/2023)  Utilities: Not At Risk (05/02/2023)  Depression (PHQ2-9): Low Risk  (02/19/2023)  Tobacco Use: High Risk (05/02/2023)     Readmission Risk Interventions     No data to display

## 2023-05-03 NOTE — Anesthesia Postprocedure Evaluation (Signed)
Anesthesia Post Note  Patient: Travis Simpson  Procedure(s) Performed: XI ROBOTIC ASSISTED LAPAROSCOPIC RADICAL PROSTATECTOMY LEVEL 2 BILATERAL PELVIC LYMPHADENECTOMY (Bilateral)     Patient location during evaluation: PACU Anesthesia Type: General Level of consciousness: awake and alert Pain management: pain level controlled Vital Signs Assessment: post-procedure vital signs reviewed and stable Respiratory status: spontaneous breathing, nonlabored ventilation, respiratory function stable and patient connected to nasal cannula oxygen Cardiovascular status: blood pressure returned to baseline and stable Postop Assessment: no apparent nausea or vomiting Anesthetic complications: no   No notable events documented.  Last Vitals:  Vitals:   05/03/23 0014 05/03/23 0410  BP: 134/78 127/84  Pulse: 72 69  Resp: 15 18  Temp: 36.4 C 36.6 C  SpO2: 98% 98%    Last Pain:  Vitals:   05/03/23 0410  TempSrc: Oral  PainSc:                  [name] S

## 2023-05-03 NOTE — TOC Initial Note (Signed)
Transition of Care [facility]) - Initial/Assessment Note    Patient Details  Name: Travis Simpson MRN: [id number] Date of Birth: 1966-05-02  Transition [facility] Care [facility]) CM/SW Contact:    Lanier Clam, RN Phone Number: 05/03/2023, 12:23 PM  Clinical Narrative: d/c plan home.                  Expected Discharge Plan: Home/Self Care Barriers to Discharge: Continued Medical Work up   Patient Goals and CMS Choice Patient states their goals for this hospitalization and ongoing recovery are:: Home CMS Medicare.gov Compare Post Acute Care list provided to:: Patient   [place] ownership interest in [facility].provided to:: Spouse    Expected Discharge Plan and Services   Discharge Planning Services: CM Consult Post Acute Care Choice: Resumption of Svcs/PTA Provider Living arrangements for the past 2 months: Single Family Home Expected Discharge Date: 05/03/23                                    Prior Living Arrangements/Services Living arrangements for the past 2 months: Single Family Home Lives with:: Spouse                   Activities of Daily Living   ADL Screening (condition at time of admission) Independently performs ADLs?: Yes (appropriate for developmental age) Is the patient deaf or have difficulty hearing?: No Does the patient have difficulty seeing, even when wearing glasses/contacts?: No Does the patient have difficulty concentrating, remembering, or making decisions?: No  Permission Sought/Granted                  Emotional Assessment              Admission diagnosis:  Prostate cancer [facility]) [C61] Patient Active Problem List   Diagnosis Date Noted   Prostate cancer (HCC) 05/02/2023   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 [place] [address] Phone: [phone] Fax: [phone]     Social Drivers of Health (SDOH) Social History: SDOH Screenings   Food Insecurity: No Food Insecurity (05/02/2023)  Housing: Low Risk  (05/02/2023)  Transportation Needs: No Transportation Needs (05/02/2023)  Utilities: Not At Risk (05/02/2023)  Depression (PHQ2-9): Low Risk  (02/19/2023)  Tobacco Use: High Risk (05/02/2023)   SDOH Interventions:     Readmission Risk Interventions     No data to display

## 2023-05-03 NOTE — Plan of Care (Signed)
  Problem: Education: Goal: Knowledge of the procedure and recovery process will improve Outcome: Progressing   Problem: Pain Management: Goal: General experience of comfort will improve Outcome: Progressing   Problem: Urinary Elimination: Goal: Ability to avoid or minimize complications of infection will improve Outcome: Progressing

## 2023-05-03 NOTE — Progress Notes (Signed)
Patient ID: Travis Simpson, male   DOB: October 26, 1965, 57 y.o.   MRN: [id number]  1 Day Post-Op Subjective: The patient is doing well.  No nausea or vomiting. Pain is adequately controlled.  Objective: Vital signs in last 24 hours: Temp:  [97.6 F (36.4 C)-98.5 F (36.9 C)] 97.9 F (36.6 C) (12/20 0410) Pulse Rate:  [69-94] 69 (12/20 0410) Resp:  [13-20] 18 (12/20 0410) BP: (119-160)/(78-104) 127/84 (12/20 0410) SpO2:  [97 %-100 %] 98 % (12/20 0410) Weight:  [88 kg] 88 kg (12/19 0903)  Intake/Output from previous day: 12/19 0701 - 12/20 0700 In: 2965.2 [I.V.:2665.2; IV Piggyback:300] Out: 2720 [Urine:2560; Drains:60; Blood:100] Intake/Output this shift: No intake/output data recorded.  Physical Exam:  General: Alert and oriented. CV: RRR Lungs: Clear bilaterally. GI: Soft, Nondistended. Incisions: Clean, dry, and intact Urine: Clear Extremities: Nontender, no erythema, no edema.  Lab Results: Recent Labs    05/02/23 1313 05/03/23 0505  HGB 13.3 12.4*  HCT 40.8 38.4*      Assessment/Plan: POD# 1 s/p robotic prostatectomy.  1) SL IVF 2) Ambulate, Incentive spirometry 3) Transition to oral pain medication 4) Dulcolax suppository 5) D/C pelvic drain 6) Plan for likely discharge later today   Moody Bruins. MD   LOS: 0 days   Crecencio Mc 05/03/2023, 7:35 AM

## 2023-05-03 NOTE — Progress Notes (Signed)
Discharge instructions reviewed with pt. Foley and wound care education provided. JP drain site clean, dry and intact. Medications received from meds to bed. Pt discharging to home via family transport.

## 2023-05-07 LAB — SURGICAL PATHOLOGY

## 2023-05-10 DIAGNOSIS — C61 Malignant neoplasm of prostate: Secondary | ICD-10-CM

## 2023-05-20 ENCOUNTER — Encounter: Payer: Self-pay | Admitting: *Deleted

## 2023-05-30 DIAGNOSIS — M62838 Other muscle spasm: Secondary | ICD-10-CM | POA: Diagnosis not present

## 2023-05-30 DIAGNOSIS — M6281 Muscle weakness (generalized): Secondary | ICD-10-CM | POA: Diagnosis not present

## 2023-05-30 DIAGNOSIS — N393 Stress incontinence (female) (male): Secondary | ICD-10-CM | POA: Diagnosis not present

## 2023-06-11 DIAGNOSIS — N393 Stress incontinence (female) (male): Secondary | ICD-10-CM | POA: Diagnosis not present

## 2023-06-11 DIAGNOSIS — M62838 Other muscle spasm: Secondary | ICD-10-CM | POA: Diagnosis not present

## 2023-06-11 DIAGNOSIS — M6281 Muscle weakness (generalized): Secondary | ICD-10-CM | POA: Diagnosis not present

## 2023-06-12 ENCOUNTER — Other Ambulatory Visit (HOSPITAL_COMMUNITY): Payer: Self-pay

## 2023-07-01 ENCOUNTER — Inpatient Hospital Stay: Payer: 59 | Attending: Radiation Oncology | Admitting: *Deleted

## 2023-07-01 ENCOUNTER — Encounter: Payer: Self-pay | Admitting: *Deleted

## 2023-07-01 DIAGNOSIS — C61 Malignant neoplasm of prostate: Secondary | ICD-10-CM

## 2023-07-01 NOTE — Progress Notes (Signed)
SCP reviewed and completed. Pt will have post-tx PSA labs done on 07/09/2023 and f/u with Dr. Laverle Patter the following week at [facility]. A copy of tx summary has been faxed to PCP.

## 2023-07-09 DIAGNOSIS — N393 Stress incontinence (female) (male): Secondary | ICD-10-CM | POA: Diagnosis not present

## 2023-07-09 DIAGNOSIS — M62838 Other muscle spasm: Secondary | ICD-10-CM | POA: Diagnosis not present

## 2023-07-09 DIAGNOSIS — C61 Malignant neoplasm of prostate: Secondary | ICD-10-CM | POA: Diagnosis not present

## 2023-07-09 DIAGNOSIS — M6281 Muscle weakness (generalized): Secondary | ICD-10-CM | POA: Diagnosis not present

## 2023-09-16 ENCOUNTER — Other Ambulatory Visit (HOSPITAL_COMMUNITY): Payer: Self-pay

## 2023-09-16 DIAGNOSIS — Z0000 Encounter for general adult medical examination without abnormal findings: Secondary | ICD-10-CM | POA: Diagnosis not present

## 2023-09-16 DIAGNOSIS — K219 Gastro-esophageal reflux disease without esophagitis: Secondary | ICD-10-CM | POA: Diagnosis not present

## 2023-09-16 DIAGNOSIS — E7800 Pure hypercholesterolemia, unspecified: Secondary | ICD-10-CM | POA: Diagnosis not present

## 2023-09-16 DIAGNOSIS — Z8546 Personal history of malignant neoplasm of prostate: Secondary | ICD-10-CM | POA: Diagnosis not present

## 2023-09-16 DIAGNOSIS — N529 Male erectile dysfunction, unspecified: Secondary | ICD-10-CM | POA: Diagnosis not present

## 2023-09-16 MED ORDER — PANTOPRAZOLE SODIUM 40 MG PO TBEC
40.0000 mg | DELAYED_RELEASE_TABLET | Freq: Every morning | ORAL | 1 refills | Status: AC
Start: 2023-09-16 — End: ?
  Filled 2023-09-16 – 2023-10-10 (×2): qty 30, 30d supply, fill #0

## 2023-09-26 ENCOUNTER — Other Ambulatory Visit (HOSPITAL_COMMUNITY): Payer: Self-pay

## 2023-10-10 ENCOUNTER — Other Ambulatory Visit (HOSPITAL_COMMUNITY): Payer: Self-pay

## 2023-10-11 ENCOUNTER — Other Ambulatory Visit (HOSPITAL_COMMUNITY): Payer: Self-pay

## 2023-10-11 DIAGNOSIS — R0981 Nasal congestion: Secondary | ICD-10-CM | POA: Diagnosis not present

## 2023-10-11 DIAGNOSIS — R030 Elevated blood-pressure reading, without diagnosis of hypertension: Secondary | ICD-10-CM | POA: Diagnosis not present

## 2023-10-11 MED ORDER — CETIRIZINE HCL 10 MG PO TABS
10.0000 mg | ORAL_TABLET | Freq: Every day | ORAL | 1 refills | Status: AC
  Filled 2023-10-11: qty 90, 90d supply, fill #0

## 2023-10-11 MED ORDER — NAPROXEN 500 MG PO TABS
500.0000 mg | ORAL_TABLET | Freq: Two times a day (BID) | ORAL | 0 refills | Status: AC
  Filled 2023-10-11: qty 28, 14d supply, fill #0

## 2023-10-11 MED ORDER — FLUTICASONE PROPIONATE 50 MCG/ACT NA SUSP
1.0000 | Freq: Two times a day (BID) | NASAL | 0 refills | Status: AC
  Filled 2023-10-11: qty 16, 30d supply, fill #0

## 2023-10-12 ENCOUNTER — Other Ambulatory Visit (HOSPITAL_COMMUNITY): Payer: Self-pay

## 2023-11-01 ENCOUNTER — Other Ambulatory Visit (HOSPITAL_COMMUNITY): Payer: Self-pay

## 2023-11-01 DIAGNOSIS — I10 Essential (primary) hypertension: Secondary | ICD-10-CM | POA: Diagnosis not present

## 2023-11-01 MED ORDER — AMLODIPINE BESYLATE 5 MG PO TABS
5.0000 mg | ORAL_TABLET | Freq: Every day | ORAL | 0 refills | Status: DC
  Filled 2023-11-01: qty 30, 30d supply, fill #0

## 2023-11-07 ENCOUNTER — Ambulatory Visit: Payer: Self-pay | Admitting: Surgery

## 2023-11-07 DIAGNOSIS — K432 Incisional hernia without obstruction or gangrene: Secondary | ICD-10-CM | POA: Diagnosis not present

## 2023-11-29 ENCOUNTER — Other Ambulatory Visit (HOSPITAL_COMMUNITY): Payer: Self-pay

## 2023-11-29 MED ORDER — AMLODIPINE BESYLATE 5 MG PO TABS
5.0000 mg | ORAL_TABLET | Freq: Every day | ORAL | 0 refills | Status: DC
  Filled 2023-11-29: qty 30, 30d supply, fill #0

## 2023-12-05 ENCOUNTER — Other Ambulatory Visit (HOSPITAL_COMMUNITY): Payer: Self-pay

## 2023-12-05 DIAGNOSIS — I10 Essential (primary) hypertension: Secondary | ICD-10-CM | POA: Diagnosis not present

## 2023-12-05 DIAGNOSIS — E7800 Pure hypercholesterolemia, unspecified: Secondary | ICD-10-CM | POA: Diagnosis not present

## 2023-12-05 DIAGNOSIS — K219 Gastro-esophageal reflux disease without esophagitis: Secondary | ICD-10-CM | POA: Diagnosis not present

## 2023-12-05 MED ORDER — OLMESARTAN MEDOXOMIL 20 MG PO TABS
20.0000 mg | ORAL_TABLET | Freq: Every day | ORAL | 2 refills | Status: DC
  Filled 2023-12-05: qty 30, 30d supply, fill #0

## 2023-12-25 ENCOUNTER — Other Ambulatory Visit (HOSPITAL_COMMUNITY): Payer: Self-pay

## 2023-12-25 MED ORDER — AMLODIPINE-OLMESARTAN 5-20 MG PO TABS
1.0000 | ORAL_TABLET | Freq: Every day | ORAL | 1 refills | Status: DC
  Filled 2023-12-25 – 2024-01-08 (×4): qty 90, 90d supply, fill #0
  Filled 2024-04-20: qty 90, 90d supply, fill #1

## 2024-01-06 ENCOUNTER — Other Ambulatory Visit (HOSPITAL_COMMUNITY): Payer: Self-pay

## 2024-01-07 ENCOUNTER — Other Ambulatory Visit (HOSPITAL_COMMUNITY): Payer: Self-pay

## 2024-01-08 ENCOUNTER — Encounter (HOSPITAL_COMMUNITY): Payer: Self-pay

## 2024-01-08 ENCOUNTER — Other Ambulatory Visit (HOSPITAL_COMMUNITY): Payer: Self-pay

## 2024-01-08 NOTE — Progress Notes (Addendum)
 PCP - Alberta Sharps, MD   LOV 12-05-23 CE Cardiologist - no  PPM/ICD -  Device Orders -  Rep Notified -   Chest x-ray -  EKG - 01-14-24  preop Stress Test -  ECHO -  Cardiac Cath -   Sleep Study -  CPAP -   Fasting Blood Sugar n/a-  Checks Blood Sugar __n/a___ times a day  Blood Thinner Instructions:n/a Aspirin Instructions:n/a  ERAS Protcol -y PRE-SURGERY n/a   COVID vaccine -yes  Activity--Able to climb a flight of stairs with no CP or SOB Anesthesia review: HTN  Patient denies shortness of breath, fever, cough and chest pain at PAT appointment   All instructions explained to the patient, with a verbal understanding of the material. Patient agrees to go over the instructions while at home for a better understanding. Patient also instructed to self quarantine after being tested for COVID-19. The opportunity to ask questions was provided.

## 2024-01-08 NOTE — Patient Instructions (Addendum)
 SURGICAL WAITING ROOM VISITATION  Patients having surgery or a procedure may have no more than 2 support people in the waiting area - these visitors may rotate.    Children under the age of 52 must have an adult with them who is not the patient.  Visitors with respiratory illnesses are discouraged from visiting and should remain at home.  If the patient needs to stay at the hospital during part of their recovery, the visitor guidelines for inpatient rooms apply. Pre-op nurse will coordinate an appropriate time for 1 support person to accompany patient in pre-op.  This support person may not rotate.    Please refer to the [facility] website for the visitor guidelines for Inpatients (after your surgery is over and you are in a regular room).       Your procedure is scheduled on: 01-23-24   Report to [facility] Main Entrance    Report to admitting at    0515 AM   Call this number if you have problems the morning of surgery [phone]   Do not eat food :After Midnight.   After Midnight you may have the following liquids until __0430____ AM/ DAY OF SURGERY  then nothing by mouth  Water  Non-Citrus Juices (without pulp, NO RED-Apple, White grape, White cranberry) Black Coffee (NO MILK/CREAM OR CREAMERS, sugar ok)  Clear Tea (NO MILK/CREAM OR CREAMERS, sugar ok) regular and decaf                             Plain Jell-O (NO RED)                                           Fruit ices (not with fruit pulp, NO RED)                                     Popsicles (NO RED)                                                               Sports drinks like Gatorade (NO RED)                          If you have questions, please contact your surgeon's office.   FOLLOW ANY ADDITIONAL PRE OP INSTRUCTIONS YOU RECEIVED FROM YOUR SURGEON'S OFFICE!!!     Oral Hygiene is also important to reduce your risk of infection.                                    Remember - BRUSH YOUR TEETH THE  MORNING OF SURGERY WITH YOUR REGULAR TOOTHPASTE  DENTURES WILL BE REMOVED PRIOR TO SURGERY PLEASE DO NOT APPLY Poly grip OR ADHESIVES!!!   Do NOT smoke after Midnight   Stop all vitamins and herbal supplements 7 days before surgery.   Take these medicines the morning of surgery with A SIP OF WATER :NONE    Bring CPAP mask and tubing  day of surgery.                              You may not have any metal on your body including hair pins, jewelry, and body piercing             Do not wear  lotions, powders, cologne, or deodorant                Men may shave face and neck.   Do not bring valuables to the hospital. [facility] IS NOT             RESPONSIBLE   FOR VALUABLES.   Contacts, glasses, dentures or bridgework may not be worn into surgery.   Bring small overnight bag day of surgery.   DO NOT BRING YOUR HOME MEDICATIONS TO THE HOSPITAL. PHARMACY WILL DISPENSE MEDICATIONS LISTED ON YOUR MEDICATION LIST TO YOU DURING YOUR ADMISSION IN THE HOSPITAL!    Patients discharged on the day of surgery will not be allowed to drive home.  Someone NEEDS to stay with you for the first 24 hours after anesthesia.   Special Instructions: Bring a copy of your healthcare power of attorney and living will documents the day of surgery if you haven't scanned them before.              Please read over the following fact sheets you were given: IF YOU HAVE QUESTIONS ABOUT YOUR PRE-OP INSTRUCTIONS PLEASE CALL [phone].    If you test positive for Covid or have been in contact with anyone that has tested positive in the last 10 days please notify you surgeon.    [facility] - Preparing for Surgery Before surgery, you can play an important role.  Because skin is not sterile, your skin needs to be as free of germs as possible.  You can reduce the number of germs on your skin by washing with CHG (chlorahexidine gluconate) soap before surgery.  CHG is an antiseptic cleaner which kills germs and bonds  with the skin to continue killing germs even after washing. Please DO NOT use if you have an allergy to CHG or antibacterial soaps.  If your skin becomes reddened/irritated stop using the CHG and inform your nurse when you arrive at Short Stay. Do not shave (including legs and underarms) for at least 48 hours prior to the first CHG shower.  You may shave your face/neck. Please follow these instructions carefully:  1.  Shower with CHG Soap the night before surgery and the  morning of Surgery.  2.  If you choose to wash your hair, wash your hair first as usual with your  normal  shampoo.  3.  After you shampoo, rinse your hair and body thoroughly to remove the  shampoo.                           4.  Use CHG as you would any other liquid soap.  You can apply chg directly  to the skin and wash                       Gently with a scrungie or clean washcloth.  5.  Apply the CHG Soap to your body ONLY FROM THE NECK DOWN.   Do not use on face/ open  Wound or open sores. Avoid contact with eyes, ears mouth and genitals (private parts).                       Wash face,  Genitals (private parts) with your normal soap.             6.  Wash thoroughly, paying special attention to the area where your surgery  will be performed.  7.  Thoroughly rinse your body with warm water  from the neck down.  8.  DO NOT shower/wash with your normal soap after using and rinsing off  the CHG Soap.                9.  Pat yourself dry with a clean towel.            10.  Wear clean pajamas.            11.  Place clean sheets on your bed the night of your first shower and do not  sleep with pets. Day of Surgery : Do not apply any lotions/deodorants the morning of surgery.  Please wear clean clothes to the hospital/surgery center.  FAILURE TO FOLLOW THESE INSTRUCTIONS MAY RESULT IN THE CANCELLATION OF YOUR SURGERY PATIENT SIGNATURE_________________________________  NURSE  SIGNATURE__________________________________  ________________________________________________________________________

## 2024-01-14 ENCOUNTER — Other Ambulatory Visit: Payer: Self-pay

## 2024-01-14 ENCOUNTER — Encounter (HOSPITAL_COMMUNITY): Payer: Self-pay

## 2024-01-14 ENCOUNTER — Encounter (HOSPITAL_COMMUNITY)
Admission: RE | Admit: 2024-01-14 | Discharge: 2024-01-14 | Disposition: A | Discharge status: Home / Self Care | Source: Ambulatory Visit | Attending: Surgery | Admitting: Surgery

## 2024-01-14 VITALS — BP 129/96 | HR 98 | Temp 98.2°F | Resp 16 | Ht 74.5 in | Wt 189.0 lb

## 2024-01-14 DIAGNOSIS — Z01812 Encounter for preprocedural laboratory examination: Secondary | ICD-10-CM | POA: Diagnosis present

## 2024-01-14 DIAGNOSIS — Z01818 Encounter for other preprocedural examination: Secondary | ICD-10-CM | POA: Insufficient documentation

## 2024-01-14 DIAGNOSIS — I10 Essential (primary) hypertension: Secondary | ICD-10-CM | POA: Insufficient documentation

## 2024-01-14 DIAGNOSIS — Z01810 Encounter for preprocedural cardiovascular examination: Secondary | ICD-10-CM | POA: Diagnosis present

## 2024-01-14 HISTORY — DX: Essential (primary) hypertension: I10

## 2024-01-14 LAB — BASIC METABOLIC PANEL WITH GFR
Anion gap: 13 (ref 5–15)
BUN: 12 mg/dL (ref 6–20)
CO2: 22 mmol/L (ref 22–32)
Calcium: 9.7 mg/dL (ref 8.9–10.3)
Chloride: 102 mmol/L (ref 98–111)
Creatinine, Ser: 0.82 mg/dL (ref 0.61–1.24)
GFR, Estimated: >60 mL/min (ref >60)
Glucose, Bld: 107 mg/dL — ABNORMAL HIGH (ref 70–99)
Potassium: 4.5 mmol/L (ref 3.5–5.1)
Sodium: 137 mmol/L (ref 135–145)

## 2024-01-14 LAB — CBC
HCT: 50.1 % (ref 39.0–52.0)
Hemoglobin: 15.7 g/dL (ref 13.0–17.0)
MCH: 28.7 pg (ref 26.0–34.0)
MCHC: 31.3 g/dL (ref 30.0–36.0)
MCV: 91.6 fL (ref 80.0–100.0)
Platelets: 279 K/uL (ref 150–400)
RBC: 5.47 MIL/uL (ref 4.22–5.81)
RDW: 13.5 % (ref 11.5–15.5)
WBC: 12.5 K/uL — ABNORMAL HIGH (ref 4.0–10.5)
nRBC: 0 % (ref 0.0–0.2)

## 2024-01-16 ENCOUNTER — Encounter (HOSPITAL_COMMUNITY): Payer: Self-pay | Admitting: Surgery

## 2024-01-16 DIAGNOSIS — K432 Incisional hernia without obstruction or gangrene: Secondary | ICD-10-CM | POA: Diagnosis present

## 2024-01-16 NOTE — H&P (Signed)
 PROVIDER: Suriya Kovarik OZELL SPINNER, MD   Chief Complaint: New Consultation (Incisional hernia)  History of Present Illness:  Patient is self-referred. 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 Patient has developed a ventral incisional hernia at the site of one of the ports which was used for his robotic prostate surgery in December 2024. This is gradually increased in size. It has always been reducible. Patient is wearing an abdominal binder for support. Patient is interested in proceeding with operative repair. Patient has had no prior hernia surgery. He is accompanied today by his wife.  Review of Systems: A complete review of systems was obtained from the patient. I have reviewed this information and discussed as appropriate with the patient. See HPI as well for other ROS.  Review of Systems  Constitutional: Negative.  HENT: Negative.  Eyes: Negative.  Respiratory: Negative.  Cardiovascular: Negative.  Gastrointestinal: Negative.  Genitourinary: Negative.  Musculoskeletal: Negative.  Skin: Negative.  Neurological: Negative.  Endo/Heme/Allergies: Negative.  Psychiatric/Behavioral: Negative.    Medical History: History reviewed. No pertinent past medical history.  Patient Active Problem List  Diagnosis  Ventral incisional hernia   History reviewed. No pertinent surgical history.   No Known Allergies  No current outpatient medications on file prior to visit.   No current facility-administered medications on file prior to visit.   History reviewed. No pertinent family history.   Social History   Tobacco Use  Smoking Status Not on file  Smokeless Tobacco Not on file    Social History   Socioeconomic History  Marital status: Married   Social Drivers of Health   Food Insecurity: No Food Insecurity (05/02/2023)  Received from [facility]  Hunger Vital Sign  Within the past 12 months, you worried that your food would run out before you got  the money to buy more.: Never true  Within the past 12 months, the food you bought just didn't last and you didn't have money to get more.: Never true  Transportation Needs: No Transportation Needs (05/02/2023)  Received from [facility] - Transportation  Lack of Transportation (Medical): No  Lack of Transportation (Non-Medical): No  Housing Stability: Unknown (11/07/2023)  Housing Stability Vital Sign  Homeless in the Last Year: No   Objective:   Vitals:  BP: 138/80  Pulse: 94  Temp: 36.7 C (98 F)  SpO2: 96%  Weight: 89.5 kg (197 lb 6.4 oz)  Height: 188 cm (6' 2)  PainSc: 0-No pain   Body mass index is 25.34 kg/m.  Physical Exam   GENERAL APPEARANCE Comfortable, no acute issues Development: normal Gross deformities: none  SKIN Rash, lesions, ulcers: none Induration, erythema: none Nodules: none palpable  EYES Conjunctiva and lids: normal Pupils: equal  EARS, NOSE, MOUTH, THROAT External ears: no lesion or deformity External nose: no lesion or deformity Hearing: grossly normal  NECK Symmetric: yes Trachea: midline Thyroid: no palpable nodules in the thyroid bed Well-healed anterior cervical incision consistent with cervical fusion  CHEST/CV Not assessed  ABDOMEN Soft without distention. Well-healed surgical incisions. There is an obvious bulge just to the right of the midline above the level of the umbilicus consistent with a ventral incisional hernia. This is reducible. On palpation the fascial defect measures approximately 3 cm in diameter. It is nontender.  GENITOURINARY/RECTAL Not assessed  MUSCULOSKELETAL Station and gait: normal Digits and nails: no clubbing or cyanosis Muscle strength: grossly normal all extremities Deformity: none  LYMPHATIC Cervical: none palpable Supraclavicular: none palpable  PSYCHIATRIC Oriented to person, place, and time: yes Mood and affect: normal for situation Judgment and insight: appropriate for  situation   Assessment and Plan:   Ventral incisional hernia  Patient presents for evaluation for operative repair of ventral incisional hernia. Patient is provided with written literature on hernia surgery to review at home. He is accompanied today by his wife.  Today we reviewed his clinical history. On examination he does have an incisional hernia above the level of the umbilicus with a fascial defect of approximately 3 cm in diameter. Hernia is reducible but spontaneously prolapses when standing. Today we discussed operative repair. We discussed the use of prosthetic mesh patch. We discussed doing this as an outpatient procedure under general anesthesia. We discussed the postoperative recovery and restrictions on his activities. Given the fact that the patient does strenuous lifting for his job in the operating room, he will likely need to be completely out of work for 6 weeks following the operation. We discussed the procedure, the size and location of the surgical incision, the use of prosthetic mesh, the hospital stay to be anticipated, and the anticipated postoperative recovery. The patient and his wife understand and wish to proceed in the near future.   Krystal Spinner, MD [facility] practice Office: [phone]

## 2024-01-20 DIAGNOSIS — Z7689 Persons encountering health services in other specified circumstances: Secondary | ICD-10-CM | POA: Diagnosis not present

## 2024-01-22 NOTE — Anesthesia Preprocedure Evaluation (Signed)
 Anesthesia Evaluation  Patient identified by MRN, date of birth, ID band Patient awake    Reviewed: Allergy & Precautions, H&P , NPO status , Patient's Chart, lab work & pertinent test results  Airway Mallampati: II  TM Distance: >3 FB Neck ROM: Full    Dental no notable dental hx. (+) Teeth Intact, Dental Advisory Given   Pulmonary Current Smoker and Patient abstained from smoking.   Pulmonary exam normal breath sounds clear to auscultation       Cardiovascular Exercise Tolerance: Good hypertension, Pt. on medications  Rhythm:Regular Rate:Normal     Neuro/Psych negative neurological ROS  negative psych ROS   GI/Hepatic negative GI ROS, Neg liver ROS,,,  Endo/Other  negative endocrine ROS    Renal/GU negative Renal ROS  negative genitourinary   Musculoskeletal   Abdominal   Peds  Hematology negative hematology ROS (+)   Anesthesia Other Findings   Reproductive/Obstetrics negative OB ROS                              Anesthesia Physical Anesthesia Plan  ASA: 2  Anesthesia Plan: General   Post-op Pain Management: Tylenol  PO (pre-op)* and Toradol  IV (intra-op)*   Induction: Intravenous  PONV Risk Score and Plan: 2 and Ondansetron , Dexamethasone  and Midazolam   Airway Management Planned: Oral ETT  Additional Equipment:   Intra-op Plan:   Post-operative Plan: Extubation in OR  Informed Consent: I have reviewed the patients History and Physical, chart, labs and discussed the procedure including the risks, benefits and alternatives for the proposed anesthesia with the patient or authorized representative who has indicated his/her understanding and acceptance.     Dental advisory given  Plan Discussed with: CRNA  Anesthesia Plan Comments:          Anesthesia Quick Evaluation

## 2024-01-23 ENCOUNTER — Other Ambulatory Visit (HOSPITAL_COMMUNITY): Payer: Self-pay

## 2024-01-23 ENCOUNTER — Other Ambulatory Visit: Payer: Self-pay

## 2024-01-23 ENCOUNTER — Ambulatory Visit (HOSPITAL_COMMUNITY): Payer: Self-pay | Admitting: Anesthesiology

## 2024-01-23 ENCOUNTER — Encounter (HOSPITAL_COMMUNITY)
Admission: RE | Disposition: A | Discharge status: Home / Self Care | Payer: Self-pay | Source: Ambulatory Visit | Attending: Surgery

## 2024-01-23 ENCOUNTER — Encounter (HOSPITAL_COMMUNITY): Payer: Self-pay | Admitting: Surgery

## 2024-01-23 ENCOUNTER — Ambulatory Visit (HOSPITAL_COMMUNITY)
Admission: RE | Admit: 2024-01-23 | Discharge: 2024-01-23 | Disposition: A | Discharge status: Home / Self Care | Source: Ambulatory Visit | Attending: Surgery | Admitting: Surgery

## 2024-01-23 ENCOUNTER — Ambulatory Visit (HOSPITAL_BASED_OUTPATIENT_CLINIC_OR_DEPARTMENT_OTHER): Payer: Self-pay | Admitting: Anesthesiology

## 2024-01-23 DIAGNOSIS — I10 Essential (primary) hypertension: Secondary | ICD-10-CM | POA: Insufficient documentation

## 2024-01-23 DIAGNOSIS — Z79899 Other long term (current) drug therapy: Secondary | ICD-10-CM | POA: Insufficient documentation

## 2024-01-23 DIAGNOSIS — K432 Incisional hernia without obstruction or gangrene: Secondary | ICD-10-CM | POA: Insufficient documentation

## 2024-01-23 DIAGNOSIS — K439 Ventral hernia without obstruction or gangrene: Secondary | ICD-10-CM | POA: Diagnosis not present

## 2024-01-23 DIAGNOSIS — F17210 Nicotine dependence, cigarettes, uncomplicated: Secondary | ICD-10-CM | POA: Insufficient documentation

## 2024-01-23 HISTORY — PX: VENTRAL HERNIA REPAIR: SHX424

## 2024-01-23 SURGERY — REPAIR, HERNIA, VENTRAL
Anesthesia: General

## 2024-01-23 MED ORDER — DEXAMETHASONE SODIUM PHOSPHATE 10 MG/ML IJ SOLN
INTRAMUSCULAR | Status: DC | PRN
  Administered 2024-01-23: 10 mg via INTRAVENOUS

## 2024-01-23 MED ORDER — BUPIVACAINE HCL (PF) 0.5 % IJ SOLN
INTRAMUSCULAR | Status: DC | PRN
  Administered 2024-01-23: 30 mL

## 2024-01-23 MED ORDER — LACTATED RINGERS IV SOLN: INTRAVENOUS | Status: DC

## 2024-01-23 MED ORDER — ROCURONIUM BROMIDE 10 MG/ML (PF) SYRINGE
PREFILLED_SYRINGE | INTRAVENOUS | Status: AC
  Filled 2024-01-23: qty 10

## 2024-01-23 MED ORDER — ORAL CARE MOUTH RINSE: 15.0000 mL | Freq: Once | OROMUCOSAL | Status: AC

## 2024-01-23 MED ORDER — FENTANYL CITRATE (PF) 100 MCG/2ML IJ SOLN
INTRAMUSCULAR | Status: AC
  Filled 2024-01-23: qty 2

## 2024-01-23 MED ORDER — PROPOFOL 10 MG/ML IV BOLUS
INTRAVENOUS | Status: DC | PRN
  Administered 2024-01-23: 50 mg via INTRAVENOUS
  Administered 2024-01-23: 150 mg via INTRAVENOUS

## 2024-01-23 MED ORDER — HYDROMORPHONE HCL 1 MG/ML IJ SOLN
INTRAMUSCULAR | Status: AC
  Filled 2024-01-23: qty 1

## 2024-01-23 MED ORDER — MIDAZOLAM HCL 2 MG/2ML IJ SOLN
INTRAMUSCULAR | Status: DC | PRN
  Administered 2024-01-23: 2 mg via INTRAVENOUS

## 2024-01-23 MED ORDER — LIDOCAINE HCL (CARDIAC) PF 100 MG/5ML IV SOSY
PREFILLED_SYRINGE | INTRAVENOUS | Status: DC | PRN
  Administered 2024-01-23: 60 mg via INTRAVENOUS

## 2024-01-23 MED ORDER — HYDROMORPHONE HCL 1 MG/ML IJ SOLN
0.2500 mg | INTRAMUSCULAR | Status: DC | PRN
  Administered 2024-01-23 (×4): 0.5 mg via INTRAVENOUS

## 2024-01-23 MED ORDER — KETOROLAC TROMETHAMINE 30 MG/ML IJ SOLN
INTRAMUSCULAR | Status: DC | PRN
Start: 2024-01-23 — End: 2024-01-23
  Administered 2024-01-23: 30 mg via INTRAVENOUS

## 2024-01-23 MED ORDER — MIDAZOLAM HCL 2 MG/2ML IJ SOLN
INTRAMUSCULAR | Status: AC
  Filled 2024-01-23: qty 2

## 2024-01-23 MED ORDER — LIDOCAINE HCL (PF) 2 % IJ SOLN
INTRAMUSCULAR | Status: AC
Start: 2024-01-23 — End: 2024-01-23
  Filled 2024-01-23: qty 5

## 2024-01-23 MED ORDER — SUGAMMADEX SODIUM 200 MG/2ML IV SOLN
INTRAVENOUS | Status: AC
Start: 2024-01-23 — End: 2024-01-23
  Filled 2024-01-23: qty 2

## 2024-01-23 MED ORDER — BUPIVACAINE HCL (PF) 0.5 % IJ SOLN
INTRAMUSCULAR | Status: AC
  Filled 2024-01-23: qty 30

## 2024-01-23 MED ORDER — CHLORHEXIDINE GLUCONATE 0.12 % MT SOLN
15.0000 mL | Freq: Once | OROMUCOSAL | Status: AC
  Administered 2024-01-23: 15 mL via OROMUCOSAL

## 2024-01-23 MED ORDER — CEFAZOLIN SODIUM-DEXTROSE 2-4 GM/100ML-% IV SOLN
2.0000 g | INTRAVENOUS | Status: AC
Start: 2024-01-23 — End: 2024-01-23
  Administered 2024-01-23: 2 g via INTRAVENOUS
  Filled 2024-01-23: qty 100

## 2024-01-23 MED ORDER — KETOROLAC TROMETHAMINE 30 MG/ML IJ SOLN
INTRAMUSCULAR | Status: AC
Start: 2024-01-23 — End: 2024-01-23
  Filled 2024-01-23: qty 1

## 2024-01-23 MED ORDER — ONDANSETRON HCL 4 MG/2ML IJ SOLN
INTRAMUSCULAR | Status: DC | PRN
  Administered 2024-01-23: 4 mg via INTRAVENOUS

## 2024-01-23 MED ORDER — PHENYLEPHRINE 80 MCG/ML (10ML) SYRINGE FOR IV PUSH (FOR BLOOD PRESSURE SUPPORT)
PREFILLED_SYRINGE | INTRAVENOUS | Status: AC
  Filled 2024-01-23: qty 10

## 2024-01-23 MED ORDER — OXYCODONE HCL 5 MG PO TABS
5.0000 mg | ORAL_TABLET | Freq: Four times a day (QID) | ORAL | 0 refills | Status: DC | PRN
  Filled 2024-01-23: qty 20, 3d supply, fill #0

## 2024-01-23 MED ORDER — ACETAMINOPHEN 500 MG PO TABS
1000.0000 mg | ORAL_TABLET | Freq: Once | ORAL | Status: AC
  Administered 2024-01-23: 1000 mg via ORAL
  Filled 2024-01-23: qty 2

## 2024-01-23 MED ORDER — SUGAMMADEX SODIUM 200 MG/2ML IV SOLN
INTRAVENOUS | Status: DC | PRN
  Administered 2024-01-23: 200 mg via INTRAVENOUS

## 2024-01-23 MED ORDER — CHLORHEXIDINE GLUCONATE CLOTH 2 % EX PADS
6.0000 | MEDICATED_PAD | Freq: Once | CUTANEOUS | Status: DC
Start: 2024-01-23 — End: 2024-01-23

## 2024-01-23 MED ORDER — CHLORHEXIDINE GLUCONATE CLOTH 2 % EX PADS: 6.0000 | MEDICATED_PAD | Freq: Once | CUTANEOUS | Status: DC

## 2024-01-23 MED ORDER — PROPOFOL 1000 MG/100ML IV EMUL
INTRAVENOUS | Status: AC
  Filled 2024-01-23: qty 100

## 2024-01-23 MED ORDER — ROCURONIUM BROMIDE 10 MG/ML (PF) SYRINGE
PREFILLED_SYRINGE | INTRAVENOUS | Status: DC | PRN
  Administered 2024-01-23: 50 mg via INTRAVENOUS
  Administered 2024-01-23: 10 mg via INTRAVENOUS

## 2024-01-23 MED ORDER — PHENYLEPHRINE 80 MCG/ML (10ML) SYRINGE FOR IV PUSH (FOR BLOOD PRESSURE SUPPORT)
PREFILLED_SYRINGE | INTRAVENOUS | Status: DC | PRN
  Administered 2024-01-23: 160 ug via INTRAVENOUS

## 2024-01-23 MED ORDER — PROPOFOL 10 MG/ML IV BOLUS
INTRAVENOUS | Status: AC
  Filled 2024-01-23: qty 20

## 2024-01-23 MED ORDER — DEXAMETHASONE SODIUM PHOSPHATE 10 MG/ML IJ SOLN
INTRAMUSCULAR | Status: AC
  Filled 2024-01-23: qty 1

## 2024-01-23 MED ORDER — 0.9 % SODIUM CHLORIDE (POUR BTL) OPTIME
TOPICAL | Status: DC | PRN
  Administered 2024-01-23: 1000 mL

## 2024-01-23 MED ORDER — ONDANSETRON HCL 4 MG/2ML IJ SOLN
INTRAMUSCULAR | Status: AC
  Filled 2024-01-23: qty 2

## 2024-01-23 MED ORDER — FENTANYL CITRATE (PF) 100 MCG/2ML IJ SOLN
INTRAMUSCULAR | Status: DC | PRN
  Administered 2024-01-23 (×4): 50 ug via INTRAVENOUS

## 2024-01-23 SURGICAL SUPPLY — 22 items
BAG COUNTER SPONGE SURGICOUNT (BAG) IMPLANT
BINDER ABDOMINAL 12 ML 46-62 (SOFTGOODS) IMPLANT
CHLORAPREP W/TINT 26 (MISCELLANEOUS) ×2 IMPLANT
COVER SURGICAL LIGHT HANDLE (MISCELLANEOUS) ×1 IMPLANT
DERMABOND ADVANCED .7 DNX12 (GAUZE/BANDAGES/DRESSINGS) IMPLANT
DRAPE LAPAROSCOPIC ABDOMINAL (DRAPES) ×1 IMPLANT
ELECT REM PT RETURN 15FT ADLT (MISCELLANEOUS) ×1 IMPLANT
GAUZE SPONGE 4X4 12PLY STRL (GAUZE/BANDAGES/DRESSINGS) ×1 IMPLANT
GLOVE SURG ORTHO 8.0 STRL STRW (GLOVE) ×1 IMPLANT
GLOVE SURG SYN 7.5 PF PI (GLOVE) ×1 IMPLANT
GOWN STRL REUS W/ TWL XL LVL3 (GOWN DISPOSABLE) ×2 IMPLANT
KIT BASIN OR (CUSTOM PROCEDURE TRAY) ×1 IMPLANT
KIT TURNOVER KIT A (KITS) ×1 IMPLANT
MARKER SKIN DUAL TIP RULER LAB (MISCELLANEOUS) ×1 IMPLANT
MESH VENTRALEX ST 2.5 CRC MED (Mesh General) IMPLANT
PACK GENERAL/GYN (CUSTOM PROCEDURE TRAY) ×1 IMPLANT
STAPLER SKIN PROX 35W (STAPLE) ×1 IMPLANT
SUT NOVA 1 T20/GS 25DT (SUTURE) IMPLANT
SUT NOVA NAB GS-21 0 18 T12 DT (SUTURE) IMPLANT
SUT VIC AB 2-0 CT2 27 (SUTURE) IMPLANT
TOWEL OR 17X26 10 PK STRL BLUE (TOWEL DISPOSABLE) ×1 IMPLANT
TRAY FOLEY MTR SLVR 16FR STAT (SET/KITS/TRAYS/PACK) IMPLANT

## 2024-01-23 NOTE — Transfer of Care (Signed)
 Immediate Anesthesia Transfer of Care Note  Patient: Travis Simpson  Procedure(s) Performed: OPEN VENTRAL INCISIONAL HERNIA REPAIR WITH MESH PATCH  Patient Location: PACU  Anesthesia Type:General  Level of Consciousness: awake  Airway & Oxygen Therapy: Patient Spontanous Breathing and Patient connected to face mask oxygen  Post-op Assessment: Report given to RN and Post -op Vital signs reviewed and stable  Post vital signs: Reviewed and stable  Last Vitals:  Vitals Value Taken Time  BP 148/95 01/23/24 08:40  Temp    Pulse 92 01/23/24 08:41  Resp 15 01/23/24 08:41  SpO2 100 % 01/23/24 08:41  Vitals shown include unfiled device data.  Last Pain:  Vitals:   01/23/24 0621  TempSrc:   PainSc: 0-No pain         Complications: No notable events documented.

## 2024-01-23 NOTE — Discharge Instructions (Addendum)
[facility]  HERNIA REPAIR POST OP INSTRUCTIONS  Always review your discharge instruction sheet given to you by the facility where your surgery was performed.  A  prescription for pain medication may be sent to your pharmacy on discharge.  Take your pain medication as prescribed.  If narcotic pain medicine is not needed, then you may take acetaminophen (Tylenol) or ibuprofen (Advil) as needed.  Take your usually prescribed medications unless otherwise directed.  If you need a refill on your pain medication, please contact your pharmacy.  They will contact our office to request authorization. Prescriptions will not be filled after 5:00 PM daily or on weekends.  You should follow a light diet the first 24 hours after arrival home, such as soup and crackers or toast.  Be sure to include plenty of fluids daily.  Resume your normal diet the day after surgery.  Most patients will experience some swelling and bruising around the surgical site.  Ice packs and reclining will help.  Swelling and bruising can take several days to resolve.   It is common to experience some constipation if taking pain medication after surgery.  Increasing fluid intake and taking a stool softener (such as Colace) will usually help or prevent this problem from occurring.  A mild laxative (Milk of Magnesia or Miralax) should be taken according to package directions if there is no bowel movement after 48 hours.  You will likely have Dermabond (topical glue) over your incisions.  This seals the incisions and allows you to bathe and shower at any time after your surgery.  Glue should remain in place for up to 10 days.  It may be removed after 10 days by pealing off the Dermabond material or using Vaseline or naval jelly to remove.  ACTIVITIES:  You may resume regular (light) daily activities beginning the next day - such as daily self-care, walking, climbing stairs - gradually increasing activities as tolerated.  You  may have sexual intercourse when it is comfortable.  Refrain from any heavy lifting or straining until approved by your doctor.  You may drive when you are no longer taking prescription pain medication, when you can comfortably wear a seatbelt, and when you can safely maneuver your car and apply the brakes.  You should see your doctor in the office for a follow-up appointment approximately 2-3 weeks after your surgery.  Make sure that you call for this appointment within a day or two after you arrive home to insure a convenient appointment time.  WHEN TO CALL YOUR DOCTOR: Fever greater than 101.0 Inability to urinate Persistent nausea and/or vomiting Extreme swelling or bruising Continued bleeding from incision Increased pain, redness, or drainage from the incision  The clinic staff is available to answer your questions during regular business hours.  Please don't hesitate to call and ask to speak to one of the nurses for clinical concerns.  If you have a medical emergency, go to the nearest emergency room or call 911.  A surgeon from [facility] is always on call for the hospital.   [facility] [address], [address], [place], [place]  [address]  [phone] ? [phone] ? [phone] [phone] 

## 2024-01-23 NOTE — Anesthesia Postprocedure Evaluation (Signed)
 Anesthesia Post Note  Patient: Travis Simpson  Procedure(s) Performed: OPEN VENTRAL INCISIONAL HERNIA REPAIR WITH MESH [facility]     Patient location during evaluation: PACU Anesthesia Type: General Level of consciousness: awake and alert Pain management: pain level controlled Vital Signs Assessment: post-procedure vital signs reviewed and stable Respiratory status: spontaneous breathing, nonlabored ventilation and respiratory function stable Cardiovascular status: blood pressure returned to baseline and stable Postop Assessment: no apparent nausea or vomiting Anesthetic complications: no   No notable events documented.  Last Vitals:  Vitals:   01/23/24 0930 01/23/24 0940  BP: (!) 138/91 (!) 129/97  Pulse: 89 83  Resp: 19   Temp: 36.6 C   SpO2: 94% 97%    Last Pain:  Vitals:   01/23/24 0940  TempSrc:   PainSc: 3                  Neasia Fleeman,W. EDMOND

## 2024-01-23 NOTE — Interval H&P Note (Signed)
 History and Physical Interval Note:  01/23/2024 7:01 AM  Travis Simpson  has presented today for surgery, with the diagnosis of VENTRAL INCISIONAL HERNIA.  The various methods of treatment have been discussed with the patient and family. After consideration of risks, benefits and other options for treatment, the patient has consented to    Procedure(s) with comments: REPAIR, HERNIA, VENTRAL (N/A) - OPEN REPAIR VENTRAL INCISIONAL HERNIA WITH MESH PATCH as a surgical intervention.    The patient's history has been reviewed, patient examined, no change in status, stable for surgery.  I have reviewed the patient's chart and labs.  Questions were answered to the patient's satisfaction.    Krystal Spinner, MD [facility] A [facility] practice Office: [phone]   Krystal Spinner

## 2024-01-23 NOTE — Anesthesia Procedure Notes (Signed)
 Procedure Name: Intubation Date/Time: 01/23/2024 7:35 AM  Performed by: Therisa Doyal CROME, CRNAPre-anesthesia Checklist: Patient identified, Emergency Drugs available, Suction available and Patient being monitored Patient Re-evaluated:Patient Re-evaluated prior to induction Oxygen Delivery Method: Circle system utilized Preoxygenation: Pre-oxygenation with 100% oxygen Induction Type: IV induction Ventilation: Mask ventilation without difficulty and Oral airway inserted - appropriate to patient size Laryngoscope Size: Miller and 3 Grade View: Grade I Tube type: Oral Tube size: 8.0 mm Number of attempts: 1 Airway Equipment and Method: Stylet Placement Confirmation: ETT inserted through vocal cords under direct vision, positive ETCO2 and breath sounds checked- equal and bilateral Secured at: 22 cm Tube secured with: Tape Dental Injury: Teeth and Oropharynx as per pre-operative assessment

## 2024-01-23 NOTE — Op Note (Signed)
 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 Patient has developed a ventral incisional hernia at the site of one of the ports which was used for his robotic prostate surgery in December 2024. This is gradually increased in size. It has always been reducible. Patient is wearing an abdominal binder for support. Patient is interested in proceeding with operative repair.   Procedure:  The patient was seen in the pre-op holding area. The risks, benefits, complications, treatment options, and expected outcomes were previously discussed with the patient. The patient agreed with the proposed plan and has signed the informed consent form.  The patient was brought to the operating room by the surgical team, identified as Travis Simpson and the procedure verified. A time out was completed and the above information confirmed.  Following induction of general anesthesia, the patient is positioned and then prepped and draped in usual aseptic fashion.  After ascertaining that an adequate level of anesthesia been achieved, the previous midline incision just above the umbilicus is incised with a #10 blade.  Dissection is carried through subcutaneous tissues.  Hernia sac is identified.  Using the electrocautery the hernia sac is dissected out circumferentially.  Dissection is carried down to the fascia of the abdominal wall.  Hernia defect is just above the level of the umbilicus in the midline.  It measures 3 cm in maximum dimension.  Subcutaneous tissues are elevated circumferentially to allow for some relaxation of the fascia.   Hernia sac is completely excised.  The plane beneath the fascia is developed circumferentially.  A Ventralex ST mesh patch 6.4 cm in diameter is selected and prepared.  It is inserted into the preperitoneal space.  It is deployed circumferentially.  Fascial defect is then closed with interrupted 0 Novafil simple sutures incorporating the underlying mesh into the closure.  Fascia reapproximates without undue tension.  Good hemostasis is noted.  Local anesthesia is infiltrated into the fascia circumferentially.  Subcutaneous tissues are closed with interrupted 3-0 Vicryl sutures.  Skin is anesthetized with local anesthetic.  Skin edges are reapproximated with a running 4-0 Monocryl subcuticular suture.  Wound was washed and dried.  Dermabond is applied as dressing.  Patient is awakened from anesthesia and brought to the recovery room in stable condition.  The patient tolerated the procedure well.   Krystal Spinner, MD [facility] Office: [phone]

## 2024-01-24 ENCOUNTER — Encounter (HOSPITAL_COMMUNITY): Payer: Self-pay | Admitting: Surgery

## 2024-01-28 ENCOUNTER — Other Ambulatory Visit (HOSPITAL_COMMUNITY): Payer: Self-pay | Admitting: Surgery

## 2024-01-28 ENCOUNTER — Other Ambulatory Visit (HOSPITAL_COMMUNITY): Payer: Self-pay

## 2024-01-28 DIAGNOSIS — C61 Malignant neoplasm of prostate: Secondary | ICD-10-CM | POA: Diagnosis not present

## 2024-01-28 DIAGNOSIS — N393 Stress incontinence (female) (male): Secondary | ICD-10-CM | POA: Diagnosis not present

## 2024-01-28 DIAGNOSIS — N5201 Erectile dysfunction due to arterial insufficiency: Secondary | ICD-10-CM | POA: Diagnosis not present

## 2024-01-28 MED ORDER — OXYCODONE HCL 5 MG PO TABS
5.0000 mg | ORAL_TABLET | Freq: Four times a day (QID) | ORAL | 0 refills | Status: AC | PRN
  Filled 2024-01-28: qty 20, 3d supply, fill #0

## 2024-02-05 DIAGNOSIS — H5213 Myopia, bilateral: Secondary | ICD-10-CM | POA: Diagnosis not present

## 2024-02-05 DIAGNOSIS — H524 Presbyopia: Secondary | ICD-10-CM | POA: Diagnosis not present

## 2024-02-25 DIAGNOSIS — K432 Incisional hernia without obstruction or gangrene: Secondary | ICD-10-CM | POA: Diagnosis not present

## 2024-04-20 ENCOUNTER — Other Ambulatory Visit (HOSPITAL_COMMUNITY): Payer: Self-pay

## 2024-05-29 ENCOUNTER — Other Ambulatory Visit: Payer: Self-pay

## 2024-05-29 ENCOUNTER — Other Ambulatory Visit (HOSPITAL_COMMUNITY): Payer: Self-pay

## 2024-05-29 MED ORDER — SILDENAFIL CITRATE 100 MG PO TABS
100.0000 mg | ORAL_TABLET | ORAL | 11 refills | Status: AC
  Filled 2024-05-29: qty 6, 30d supply, fill #0

## 2024-06-18 ENCOUNTER — Other Ambulatory Visit (HOSPITAL_COMMUNITY): Payer: Self-pay

## 2024-06-18 MED ORDER — AMLODIPINE-OLMESARTAN 5-20 MG PO TABS: 1.0000 | ORAL_TABLET | Freq: Every day | ORAL | 1 refills | Status: AC

## 2024-06-19 ENCOUNTER — Other Ambulatory Visit (HOSPITAL_COMMUNITY): Payer: Self-pay
